# Patient Record
Sex: Female | Born: 1980 | Hispanic: Yes | Marital: Married | Smoking: Never smoker
Health system: Southern US, Community
[De-identification: ages and names within clinical notes are randomized; demographics above are authoritative.]

## PROBLEM LIST (undated history)

## (undated) DIAGNOSIS — F329 Major depressive disorder, single episode, unspecified: Secondary | ICD-10-CM

## (undated) DIAGNOSIS — Z789 Other specified health status: Secondary | ICD-10-CM

## (undated) DIAGNOSIS — F32A Depression, unspecified: Secondary | ICD-10-CM

## (undated) DIAGNOSIS — D219 Benign neoplasm of connective and other soft tissue, unspecified: Secondary | ICD-10-CM

## (undated) DIAGNOSIS — E0590 Thyrotoxicosis, unspecified without thyrotoxic crisis or storm: Secondary | ICD-10-CM

## (undated) HISTORY — PX: DILATION AND CURETTAGE OF UTERUS: SHX78

## (undated) HISTORY — DX: Thyrotoxicosis, unspecified without thyrotoxic crisis or storm: E05.90

---
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
## 2003-02-11 ENCOUNTER — Encounter: Admission: RE | Admit: 2003-02-11 | Discharge: 2003-02-11 | Payer: Self-pay | Admitting: Family Medicine

## 2003-05-29 ENCOUNTER — Emergency Department (HOSPITAL_COMMUNITY): Admission: EM | Admit: 2003-05-29 | Discharge: 2003-05-29 | Payer: Self-pay | Admitting: Emergency Medicine

## 2004-07-01 ENCOUNTER — Inpatient Hospital Stay (HOSPITAL_COMMUNITY): Admission: AD | Admit: 2004-07-01 | Discharge: 2004-07-01 | Payer: Self-pay | Admitting: Obstetrics & Gynecology

## 2004-08-23 ENCOUNTER — Ambulatory Visit (HOSPITAL_COMMUNITY): Admission: RE | Admit: 2004-08-23 | Discharge: 2004-08-23 | Payer: Self-pay | Admitting: *Deleted

## 2004-08-23 ENCOUNTER — Ambulatory Visit: Payer: Self-pay | Admitting: *Deleted

## 2004-08-30 ENCOUNTER — Ambulatory Visit: Payer: Self-pay | Admitting: *Deleted

## 2004-09-13 ENCOUNTER — Ambulatory Visit: Payer: Self-pay | Admitting: *Deleted

## 2004-09-27 ENCOUNTER — Ambulatory Visit: Payer: Self-pay | Admitting: *Deleted

## 2004-10-10 ENCOUNTER — Ambulatory Visit (HOSPITAL_COMMUNITY): Admission: RE | Admit: 2004-10-10 | Discharge: 2004-10-10 | Payer: Self-pay | Admitting: Obstetrics and Gynecology

## 2004-10-11 ENCOUNTER — Ambulatory Visit: Payer: Self-pay | Admitting: *Deleted

## 2004-10-26 ENCOUNTER — Ambulatory Visit: Payer: Self-pay | Admitting: Family Medicine

## 2004-11-03 ENCOUNTER — Inpatient Hospital Stay (HOSPITAL_COMMUNITY): Admission: AD | Admit: 2004-11-03 | Discharge: 2004-11-03 | Payer: Self-pay | Admitting: Obstetrics and Gynecology

## 2004-11-15 ENCOUNTER — Ambulatory Visit (HOSPITAL_COMMUNITY): Admission: RE | Admit: 2004-11-15 | Discharge: 2004-11-15 | Payer: Self-pay | Admitting: *Deleted

## 2004-11-16 ENCOUNTER — Ambulatory Visit: Payer: Self-pay | Admitting: Family Medicine

## 2004-11-29 ENCOUNTER — Inpatient Hospital Stay (HOSPITAL_COMMUNITY): Admission: AD | Admit: 2004-11-29 | Discharge: 2004-11-29 | Payer: Self-pay | Admitting: Obstetrics and Gynecology

## 2004-11-30 ENCOUNTER — Ambulatory Visit: Payer: Self-pay | Admitting: Family Medicine

## 2004-12-07 ENCOUNTER — Ambulatory Visit: Payer: Self-pay | Admitting: *Deleted

## 2004-12-14 ENCOUNTER — Ambulatory Visit: Payer: Self-pay | Admitting: Family Medicine

## 2004-12-21 ENCOUNTER — Ambulatory Visit: Payer: Self-pay | Admitting: Family Medicine

## 2004-12-28 ENCOUNTER — Ambulatory Visit: Payer: Self-pay | Admitting: Family Medicine

## 2005-01-04 ENCOUNTER — Ambulatory Visit: Payer: Self-pay | Admitting: Family Medicine

## 2005-01-05 ENCOUNTER — Inpatient Hospital Stay (HOSPITAL_COMMUNITY): Admission: AD | Admit: 2005-01-05 | Discharge: 2005-01-09 | Payer: Self-pay | Admitting: Obstetrics & Gynecology

## 2005-01-05 ENCOUNTER — Ambulatory Visit: Payer: Self-pay | Admitting: Obstetrics and Gynecology

## 2005-01-06 ENCOUNTER — Encounter (INDEPENDENT_AMBULATORY_CARE_PROVIDER_SITE_OTHER): Payer: Self-pay | Admitting: *Deleted

## 2005-01-10 ENCOUNTER — Inpatient Hospital Stay (HOSPITAL_COMMUNITY): Admission: AD | Admit: 2005-01-10 | Discharge: 2005-01-11 | Payer: Self-pay | Admitting: Obstetrics and Gynecology

## 2008-01-27 ENCOUNTER — Inpatient Hospital Stay (HOSPITAL_COMMUNITY): Admission: AD | Admit: 2008-01-27 | Discharge: 2008-01-27 | Payer: Self-pay | Admitting: Obstetrics & Gynecology

## 2009-05-10 ENCOUNTER — Emergency Department (HOSPITAL_COMMUNITY): Admission: EM | Admit: 2009-05-10 | Discharge: 2009-05-11 | Payer: Self-pay | Admitting: Emergency Medicine

## 2010-03-22 ENCOUNTER — Ambulatory Visit: Payer: Self-pay | Admitting: Internal Medicine

## 2010-03-22 LAB — CONVERTED CEMR LAB
Cholesterol: 132 mg/dL (ref 0–200)
Eosinophils Absolute: 0.2 10*3/uL (ref 0.0–0.7)
Glucose, Bld: 90 mg/dL (ref 70–99)
HCT: 41 % (ref 36.0–46.0)
HDL: 40 mg/dL (ref >39)
Hemoglobin: 13.7 g/dL (ref 12.0–15.0)
Lymphocytes Relative: 25 % (ref 12–46)
MCHC: 33.4 g/dL (ref 30.0–36.0)
Monocytes Relative: 6 % (ref 3–12)
Neutro Abs: 5.3 10*3/uL (ref 1.7–7.7)
Potassium: 4.2 meq/L (ref 3.5–5.3)
RBC: 4.97 M/uL (ref 3.87–5.11)
RDW: 13.7 % (ref 11.5–15.5)
Sodium: 137 meq/L (ref 135–145)
Total CHOL/HDL Ratio: 3.3
Triglycerides: 186 mg/dL — ABNORMAL HIGH (ref <150)
VLDL: 37 mg/dL (ref 0–40)

## 2010-08-31 ENCOUNTER — Encounter (INDEPENDENT_AMBULATORY_CARE_PROVIDER_SITE_OTHER): Payer: Self-pay | Admitting: Family Medicine

## 2010-08-31 LAB — CONVERTED CEMR LAB: Free T4: 1.11 ng/dL (ref 0.80–1.80)

## 2010-09-27 LAB — BASIC METABOLIC PANEL
Chloride: 107 mEq/L (ref 96–112)
GFR calc Af Amer: >60 mL/min (ref >60)
Potassium: 3.5 mEq/L (ref 3.5–5.1)
Sodium: 140 mEq/L (ref 135–145)

## 2010-09-27 LAB — DIFFERENTIAL
Eosinophils Absolute: 0.2 10*3/uL (ref 0.0–0.7)
Eosinophils Relative: 2 % (ref 0–5)
Lymphocytes Relative: 32 % (ref 12–46)
Lymphs Abs: 2.6 10*3/uL (ref 0.7–4.0)
Monocytes Relative: 6 % (ref 3–12)

## 2010-09-27 LAB — CBC
HCT: 37.8 % (ref 36.0–46.0)
MCV: 80.4 fL (ref 78.0–100.0)
RBC: 4.71 MIL/uL (ref 3.87–5.11)
WBC: 8.1 10*3/uL (ref 4.0–10.5)

## 2010-09-27 LAB — POCT PREGNANCY, URINE: Preg Test, Ur: NEGATIVE

## 2010-10-17 ENCOUNTER — Emergency Department (HOSPITAL_COMMUNITY)
Admission: EM | Admit: 2010-10-17 | Discharge: 2010-10-18 | Disposition: A | Discharge status: Home / Self Care | Payer: Self-pay | Attending: Emergency Medicine | Admitting: Emergency Medicine

## 2010-10-17 DIAGNOSIS — N949 Unspecified condition associated with female genital organs and menstrual cycle: Secondary | ICD-10-CM | POA: Insufficient documentation

## 2010-10-17 DIAGNOSIS — R110 Nausea: Secondary | ICD-10-CM | POA: Insufficient documentation

## 2010-10-17 DIAGNOSIS — N739 Female pelvic inflammatory disease, unspecified: Secondary | ICD-10-CM | POA: Insufficient documentation

## 2010-10-17 DIAGNOSIS — R109 Unspecified abdominal pain: Secondary | ICD-10-CM | POA: Insufficient documentation

## 2010-10-17 DIAGNOSIS — I10 Essential (primary) hypertension: Secondary | ICD-10-CM | POA: Insufficient documentation

## 2010-10-17 LAB — COMPREHENSIVE METABOLIC PANEL
ALT: 13 U/L (ref 0–35)
AST: 20 U/L (ref 0–37)
Albumin: 4.1 g/dL (ref 3.5–5.2)
Alkaline Phosphatase: 58 U/L (ref 39–117)
CO2: 24 mEq/L (ref 19–32)
Chloride: 107 mEq/L (ref 96–112)
GFR calc Af Amer: >60 mL/min (ref >60)
GFR calc non Af Amer: >60 mL/min (ref >60)
Potassium: 3.7 mEq/L (ref 3.5–5.1)
Sodium: 135 mEq/L (ref 135–145)
Total Bilirubin: 0.3 mg/dL (ref 0.3–1.2)

## 2010-10-17 LAB — URINALYSIS, ROUTINE W REFLEX MICROSCOPIC
Glucose, UA: NEGATIVE mg/dL
Hgb urine dipstick: NEGATIVE
Specific Gravity, Urine: 1.024 (ref 1.005–1.030)
Urobilinogen, UA: 0.2 mg/dL (ref 0.0–1.0)

## 2010-10-17 LAB — DIFFERENTIAL
Basophils Relative: 0 % (ref 0–1)
Eosinophils Absolute: 0.3 10*3/uL (ref 0.0–0.7)
Monocytes Relative: 6 % (ref 3–12)
Neutro Abs: 7.4 10*3/uL (ref 1.7–7.7)
Neutrophils Relative %: 61 % (ref 43–77)

## 2010-10-17 LAB — CBC
Hemoglobin: 13.4 g/dL (ref 12.0–15.0)
MCH: 27.6 pg (ref 26.0–34.0)
Platelets: 243 10*3/uL (ref 150–400)
RBC: 4.85 MIL/uL (ref 3.87–5.11)
WBC: 12.1 10*3/uL — ABNORMAL HIGH (ref 4.0–10.5)

## 2010-10-18 ENCOUNTER — Emergency Department (HOSPITAL_COMMUNITY): Payer: Self-pay

## 2010-10-18 LAB — GC/CHLAMYDIA PROBE AMP, GENITAL
Chlamydia, DNA Probe: NEGATIVE
GC Probe Amp, Genital: NEGATIVE

## 2010-10-18 LAB — WET PREP, GENITAL: Trich, Wet Prep: NONE SEEN

## 2010-10-18 MED ORDER — IOHEXOL 300 MG/ML  SOLN
70.0000 mL | Freq: Once | INTRAMUSCULAR | Status: AC | PRN
  Administered 2010-10-18: 75 mL via INTRAVENOUS

## 2010-11-10 NOTE — Op Note (Signed)
NAMESHAMAR, KRACKE                 ACCOUNT NO.:  [id number]   MEDICAL RECORD NO.:  [id number]          PATIENT TYPE:  INP   LOCATION:  9128                          FACILITY:  [facility]   PHYSICIAN:  Lesly Dukes, M.D. DATE OF BIRTH:  April 30, 1981   DATE OF PROCEDURE:  01/06/2005  DATE OF DISCHARGE:                                 OPERATIVE REPORT   PREOPERATIVE DIAGNOSIS:  A 30 year old G1 P0 at 40-plus weeks estimated  gestational age with thick meconium, deep variables, remote delivery.   PREOPERATIVE DIAGNOSES:  28.  A 30 year old G1 P0 at 40-plus weeks estimated gestational age with      thick meconium, deep variables, remote delivery.  2.  Hyperspiraled cord.   PROCEDURE:  Primary low flap transverse cesarean section.   SURGEON:  Lesly Dukes, M.D.   ANESTHESIA:  Epidural.   ESTIMATED BLOOD LOSS:  700 cc.   COMPLICATION:  __________   PATHOLOGY:  Placenta.   FINDINGS:  Viable infant.  Apgars 9 at one minute and 9 at five minutes.  Delivered from vertex presentation.  Thick meconium.  DeLee suctioned on the  abdomen.  Hyperspiraled cord.  Otherwise, grossly normal placenta.  Venous  cord gas was 7.34.  Grossly normal uterus, ovaries, and fallopian tubes  bilaterally.   PROCEDURE:  After informed consent was obtained, the patient was taken to  the operating room, where epidural anesthesia was found to be adequate.  The  patient was placed in the dorsal supine position with a leftward tilt, and a  Foley was already in the bladder.  A Pfannenstiel skin incision was made  with a scalpel and carried down to the underlying layer of fascia. The  fascia was incised in the midline.  This incision was extended bilaterally.  Superior and inferior aspects of the fascial incision were grasped with  Kocher clamps, __________, and dissected out sharply and bluntly to the  underlying layers of rectus muscles.  The rectus muscles were separated in  the midline.  The peritoneum was  identified and entered bluntly.  This  incision was entered, with good visualization of the bladder.  The bladder  blade was inserted.  The vesicouterine peritoneum was identified, tented up,  and entered sharply with Metzenbaum scissors.  This incision was extended  bilaterally, and the bladder flap was created digitally.  The bladder blade  was reinserted.  The uterine incision was made in a transverse fashion in  the lower uterine segment.  This incision was extended bilaterally bluntly.  The head delivered easily, and then the nose and mouth were suctioned with  the DeLee.  The rest of the baby's body delivered easily.  The cord was  clamped and cut, and the baby was handed off to the awaiting pediatrician.  Cord blood was sent for type and screen, and a venous cord gas was also  sent.  The placenta was delivered manually, and the uterus exteriorized and  cleared of all clots and debris.  Pitocin and Methergine were used to help  with some mild uterine atony.  The uterine incision was  closed with 0 Vicryl  in a running-locked fashion, and good hemostasis was noted.  A second suture  of 0 Vicryl was used to reinforce portions of the incision.  The uterus was  returned to the abdomen, and the uterus was noted to be hemostatic off  tension.  The abdominal cavity was irrigated and once again uterine  hemostasis was noted.  The peritoneum and rectus muscles were noted to be  hemostatic.  The fascia was closed with 0 Vicryl in a running fashion.  Subcutaneous tissue was copiously irrigated and found to be hemostatic.  Staples were used to reapproximate the skin.  The patient tolerated the  procedure well.  Sponge, lap, instrument, and needle counts were correct x2,  and the patient went to the recovery room in stable condition.       KHL/MEDQ  D:  01/06/2005  T:  01/07/2005  Job:  [id number]

## 2010-11-10 NOTE — Group Therapy Note (Signed)
NAME:  MURL, ZOGG NO.:   MEDICAL RECORD NO.:  [id number]                   PATIENT TYPE:   LOCATION:  [facility]                           FACILITY:   PHYSICIAN:  Tinnie Gens, MD                     DATE OF BIRTH:   DATE OF SERVICE:  02/11/2003                                    CLINIC NOTE   CHIEF COMPLAINT:  GYN examination.   HISTORY OF PRESENT ILLNESS:  The patient is a 30 year old gravida 0 who is  new to this office today.  She comes in requesting Lucienne Minks for birth  control.  She has been sexually active for the last three years and has had  two partners.  She reports that she has monthly periods, although they are  not always starting on the same date.  Her last period was January 07, 2003.  She has never had a Pap smear, desires one of these as well.   PAST MEDICAL HISTORY:  Asthma as a child, she has outgrown.   PAST SURGICAL HISTORY:  None.   PAST OBSTETRICAL HISTORY:  She is a G0.   GYN HISTORY:  Menarche was at age 30.  Periods are once a month and last  five days.  Her periods are very light.  She has no known history of STDs or  has never had a Pap previously.   FAMILY HISTORY:  Significant for diabetes in her grandfather and  hypertension in a sister.   SOCIAL HISTORY:  She is an occasional alcohol drinker, but denies drug or  tobacco use.   REVIEW OF SYSTEMS:  Significant for vaginal odor for the last couple weeks.  Otherwise, review of systems is reviewed and negative, although she does  complain of a rash on her skin.   PHYSICAL EXAMINATION:  VITAL SIGNS:  Weight 93.3, pulse 111, blood pressure  104/69.  GENERAL:  She is a well-developed, well-nourished Hispanic female in no  acute distress.  NECK:  She has a somewhat enlarged thyroid that is diffusely enlarged.  ABDOMEN:  Soft, nontender, nondistended.  GENITOURINARY:  Normal external female genitalia.  Vagina is rugated.  There  is a white discharge noted.   Cervix is nulliparous and anterior.  Pap smear  is obtained as well as a wet prep.  The uterus is small and anteverted.  Adnexa are without tenderness or mass.  SKIN:  She has some hypopigmented areas on her upper extremities.   LABORATORIES:  Wet prep reveals positive clue cells.  Urine pregnancy test  was negative today.   IMPRESSION:  1. Gynecologic examination.  2. Contraceptive counseling.  3. Bacterial vaginitis.  4. Enlarged thyroid.  5. Tinea versicolor.   PLAN:  1. Pap smear.  GC, Chlamydia.  Flagyl 500 mg p.o. b.i.d.  2. Ketoconazole 400 mg p.o. daily for tinea versicolor.  3. Check TSH and consider imaging of  her thyroid depending on what her TSH     shows.  Prescription given for Ortho-Evra patch and instructions given on     how to use it.  She does receive one sample patch.  She is instructed to     start Sunday after her next menses.                                               Tinnie Gens, MD    TP/MEDQ  D:  02/11/2003  T:  02/12/2003  Job:  [id number]

## 2010-11-10 NOTE — Discharge Summary (Signed)
Janice Bernard, Janice Bernard                 ACCOUNT NO.:  [id number]   MEDICAL RECORD NO.:  [id number]          PATIENT TYPE:  INP   LOCATION:  9128                          [facility]   PHYSICIAN:  Lesly Dukes, M.D. DATE OF BIRTH:  July 29, 1980   DATE OF ADMISSION:  01/05/2005  DATE OF DISCHARGE:                                 DISCHARGE SUMMARY   ADMISSION DIAGNOSIS:  Intrauterine pregnancy at 40 and two-sevenths weeks.   DISCHARGE DIAGNOSIS:  Primary low transverse cesarean section status post  intrauterine pregnancy.   PROCEDURES:  Primary low transverse cesarean section on January 06, 2005.   FINAL DIAGNOSES:  1.  Intrauterine pregnancy at 40 and two-sevenths weeks.  2.  Primary cesarean section.  3.  Active labor at 40 and two-sevenths weeks on admission.   HOSPITAL COURSE:  This patient is a 30 year old G2 P1-0-1-0 presenting at 64  and two-sevenths weeks to the MAU with contractions that had started on January 05, 2005. The patient was admitted to labor and delivery. The patient  ruptured while on labor and delivery, spontaneous rupture. The patient began  having several large variables to the 80s with contractions. These variables  became deep. The patient had thick meconium. We were unable to start Pitocin  and the natural contractions were not changing the cervix. The patient  consented for C-section, understood the risks. Informed consent was obtained  and the patient was taken to the operating room where spinal anesthesia was  found to be adequate. The patient had a low transverse C-section performed  by Dr. Elsie Lincoln. A female infant was delivered. Cord blood was sent for  type and screen. Placenta delivered spontaneously with a three-vessel cord.  The uterus was cleared of all clots. The uterus was closed with 3-0 Vicryl  and sutured with Monocryl to reinforce the incision and hemostasis. The  patient tolerated the procedure well and was taken to the mother-baby unit.  The patient had routine postoperative care and tolerated this well.   LABORATORY DATA:  The patient's hemoglobin on discharge was 8.8, her  hematocrit was 27.3 on January 07, 2005.   MEDICATIONS:  The patient was sent out on:  1.  Ibuprofen 600 mg take one p.o. q.6h. p.r.n. pain.  2.  Prenatal vitamins take one p.o. daily.      Hei   HG/MEDQ  D:  01/09/2005  T:  01/09/2005  Job:  [id number]

## 2010-12-05 ENCOUNTER — Inpatient Hospital Stay (HOSPITAL_COMMUNITY)
Admission: AD | Admit: 2010-12-05 | Discharge: 2010-12-05 | Disposition: A | Discharge status: Home / Self Care | Payer: Self-pay | Source: Ambulatory Visit | Attending: Obstetrics & Gynecology | Admitting: Obstetrics & Gynecology

## 2010-12-05 ENCOUNTER — Inpatient Hospital Stay (HOSPITAL_COMMUNITY): Payer: Self-pay

## 2010-12-05 DIAGNOSIS — O209 Hemorrhage in early pregnancy, unspecified: Secondary | ICD-10-CM

## 2010-12-05 LAB — WET PREP, GENITAL: Clue Cells Wet Prep HPF POC: NONE SEEN

## 2010-12-05 LAB — URINALYSIS, ROUTINE W REFLEX MICROSCOPIC
Bilirubin Urine: NEGATIVE
Ketones, ur: NEGATIVE mg/dL
Leukocytes, UA: NEGATIVE
Nitrite: NEGATIVE
Protein, ur: NEGATIVE mg/dL
Urobilinogen, UA: 0.2 mg/dL (ref 0.0–1.0)
pH: 6 (ref 5.0–8.0)

## 2010-12-05 LAB — CBC
Hemoglobin: 12 g/dL (ref 12.0–15.0)
MCH: 27.2 pg (ref 26.0–34.0)
MCHC: 34.3 g/dL (ref 30.0–36.0)
Platelets: 177 10*3/uL (ref 150–400)

## 2010-12-05 LAB — POCT PREGNANCY, URINE: Preg Test, Ur: POSITIVE

## 2010-12-05 LAB — ABO/RH: ABO/RH(D): O POS

## 2010-12-05 LAB — HCG, QUANTITATIVE, PREGNANCY: hCG, Beta Chain, Quant, S: 23082 m[IU]/mL — ABNORMAL HIGH (ref <5)

## 2010-12-06 LAB — GC/CHLAMYDIA PROBE AMP, GENITAL
Chlamydia, DNA Probe: NEGATIVE
GC Probe Amp, Genital: NEGATIVE

## 2010-12-24 ENCOUNTER — Inpatient Hospital Stay (HOSPITAL_COMMUNITY)
Admission: AD | Admit: 2010-12-24 | Discharge: 2010-12-24 | Disposition: A | Discharge status: Home / Self Care | Payer: Self-pay | Source: Ambulatory Visit | Attending: Obstetrics & Gynecology | Admitting: Obstetrics & Gynecology

## 2010-12-24 DIAGNOSIS — M549 Dorsalgia, unspecified: Secondary | ICD-10-CM

## 2010-12-24 DIAGNOSIS — R109 Unspecified abdominal pain: Secondary | ICD-10-CM | POA: Insufficient documentation

## 2010-12-24 DIAGNOSIS — T7411XA Adult physical abuse, confirmed, initial encounter: Secondary | ICD-10-CM | POA: Insufficient documentation

## 2010-12-24 DIAGNOSIS — O99891 Other specified diseases and conditions complicating pregnancy: Secondary | ICD-10-CM | POA: Insufficient documentation

## 2010-12-24 DIAGNOSIS — IMO0002 Reserved for concepts with insufficient information to code with codable children: Secondary | ICD-10-CM | POA: Insufficient documentation

## 2010-12-24 DIAGNOSIS — O9989 Other specified diseases and conditions complicating pregnancy, childbirth and the puerperium: Secondary | ICD-10-CM

## 2010-12-24 LAB — URINALYSIS, ROUTINE W REFLEX MICROSCOPIC
Bilirubin Urine: NEGATIVE
Ketones, ur: NEGATIVE mg/dL
Leukocytes, UA: NEGATIVE
Nitrite: NEGATIVE
Protein, ur: NEGATIVE mg/dL
Urobilinogen, UA: 0.2 mg/dL (ref 0.0–1.0)
pH: 6 (ref 5.0–8.0)

## 2010-12-24 LAB — WET PREP, GENITAL

## 2011-01-09 ENCOUNTER — Inpatient Hospital Stay (HOSPITAL_COMMUNITY)
Admission: AD | Admit: 2011-01-09 | Discharge: 2011-01-09 | Disposition: A | Discharge status: Home / Self Care | Payer: Self-pay | Source: Ambulatory Visit | Attending: Obstetrics & Gynecology | Admitting: Obstetrics & Gynecology

## 2011-01-09 ENCOUNTER — Encounter (HOSPITAL_COMMUNITY): Payer: Self-pay | Admitting: *Deleted

## 2011-01-09 DIAGNOSIS — N760 Acute vaginitis: Secondary | ICD-10-CM | POA: Insufficient documentation

## 2011-01-09 DIAGNOSIS — A499 Bacterial infection, unspecified: Secondary | ICD-10-CM | POA: Insufficient documentation

## 2011-01-09 DIAGNOSIS — Z331 Pregnant state, incidental: Secondary | ICD-10-CM

## 2011-01-09 DIAGNOSIS — N949 Unspecified condition associated with female genital organs and menstrual cycle: Secondary | ICD-10-CM | POA: Insufficient documentation

## 2011-01-09 DIAGNOSIS — O2390 Unspecified genitourinary tract infection in pregnancy, unspecified trimester: Secondary | ICD-10-CM | POA: Insufficient documentation

## 2011-01-09 DIAGNOSIS — B9689 Other specified bacterial agents as the cause of diseases classified elsewhere: Secondary | ICD-10-CM | POA: Insufficient documentation

## 2011-01-09 HISTORY — DX: Other specified health status: Z78.9

## 2011-01-09 LAB — URINALYSIS, ROUTINE W REFLEX MICROSCOPIC
Bilirubin Urine: NEGATIVE
Ketones, ur: NEGATIVE mg/dL
Nitrite: NEGATIVE
Protein, ur: NEGATIVE mg/dL
pH: 6.5 (ref 5.0–8.0)

## 2011-01-09 LAB — WET PREP, GENITAL
Trich, Wet Prep: NONE SEEN
WBC, Wet Prep HPF POC: NONE SEEN
Yeast Wet Prep HPF POC: NONE SEEN

## 2011-01-09 MED ORDER — METRONIDAZOLE 500 MG PO TABS: 500.0000 mg | ORAL_TABLET | Freq: Three times a day (TID) | ORAL | Status: AC

## 2011-01-09 NOTE — Plan of Care (Signed)
Sp interpreteur at bedside explaining BV and treatment and answering questions re: STD's and the use of condoms.

## 2011-01-09 NOTE — Progress Notes (Signed)
Pt G2P1 at 11.3wks, reports vaginal discharge with odor x 1wk.

## 2011-01-09 NOTE — ED Provider Notes (Addendum)
History     Chief Complaint  Patient presents with  . Vaginal Discharge    pt states her partner had sex with someone else and she wants to be checked   Patient is a 30 y.o. female presenting with vaginal discharge.  Vaginal Discharge This is a new problem. The current episode started in the past 7 days. The problem has been unchanged. The symptoms are aggravated by nothing. She has tried nothing for the symptoms.  The patient states that her sex partner had sex with someone else and she wants to get checked for STI's. She is 11 wks. Gest.   Past Medical History  Diagnosis Date  . No pertinent past medical history     Past Surgical History  Procedure Date  . Cesarean section     Family History  Problem Relation Age of Onset  . Depression Paternal Grandfather     History  Substance Use Topics  . Smoking status: Not on file  . Smokeless tobacco: Not on file  . Alcohol Use:     OB History    Grav Para Term Preterm Abortions TAB SAB Ect Mult Living   2 1        1       Review of Systems  Genitourinary: Positive for vaginal discharge.  All other systems reviewed and are negative.    Physical Exam  BP 114/63  Pulse 79  Temp(Src) 98.8 F (37.1 C) (Oral)  Resp 16  SpO2 99%  LMP 09/24/2010  Physical Exam  Nursing note and vitals reviewed. Constitutional: She is oriented to person, place, and time. She appears well-developed and well-nourished.  Eyes: EOM are normal.  Neck: Neck supple.  Pulmonary/Chest: Effort normal.  Abdominal: Soft. There is no tenderness.       Positive fetal heart tones.  Genitourinary: Vagina normal. Uterus is enlarged. Cervix exhibits discharge. Cervix exhibits no motion tenderness. Right adnexum displays no tenderness. Left adnexum displays no tenderness.       Small amount of white vaginal d/c. Cervix long and closed. Uterus approx. 12 wk. Size.  Musculoskeletal: Normal range of motion.  Neurological: She is alert and oriented to  person, place, and time. No cranial nerve deficit.  Skin: Skin is warm and dry.    ED Course  Procedures  MDM Assessment: STD screening Bacterial Vaginosis at 11 wks. Gest. Cultures for GC and Chlamydia pending. Plan: will treat BV with Flagyl and await cultures, will call patient if cultures are positive.      [place], [place] 01/25/11 1843

## 2011-01-10 LAB — GC/CHLAMYDIA PROBE AMP, GENITAL: Chlamydia, DNA Probe: NEGATIVE

## 2011-02-06 ENCOUNTER — Ambulatory Visit (HOSPITAL_COMMUNITY)
Admission: AD | Admit: 2011-02-06 | Discharge: 2011-02-06 | DRG: 770 | Disposition: A | Discharge status: Home / Self Care | Payer: Self-pay | Source: Ambulatory Visit | Attending: Obstetrics & Gynecology | Admitting: Obstetrics & Gynecology

## 2011-02-06 ENCOUNTER — Inpatient Hospital Stay (HOSPITAL_COMMUNITY): Payer: Self-pay | Admitting: Anesthesiology

## 2011-02-06 ENCOUNTER — Encounter (HOSPITAL_COMMUNITY): Payer: Self-pay | Admitting: Anesthesiology

## 2011-02-06 ENCOUNTER — Inpatient Hospital Stay (HOSPITAL_COMMUNITY): Payer: Self-pay

## 2011-02-06 ENCOUNTER — Encounter (HOSPITAL_COMMUNITY)
Admission: AD | Disposition: A | Discharge status: Home / Self Care | Payer: Self-pay | Source: Ambulatory Visit | Attending: Obstetrics & Gynecology

## 2011-02-06 ENCOUNTER — Ambulatory Visit: Admit: 2011-02-06 | Payer: Self-pay | Admitting: Obstetrics & Gynecology

## 2011-02-06 ENCOUNTER — Other Ambulatory Visit: Payer: Self-pay | Admitting: Obstetrics & Gynecology

## 2011-02-06 ENCOUNTER — Encounter (HOSPITAL_COMMUNITY): Payer: Self-pay

## 2011-02-06 DIAGNOSIS — O021 Missed abortion: Secondary | ICD-10-CM

## 2011-02-06 HISTORY — PX: DILATION AND EVACUATION: SHX1459

## 2011-02-06 LAB — CBC
Hemoglobin: 12 g/dL (ref 12.0–15.0)
MCH: 27.8 pg (ref 26.0–34.0)
MCV: 81.7 fL (ref 78.0–100.0)
RBC: 4.32 MIL/uL (ref 3.87–5.11)

## 2011-02-06 LAB — URINALYSIS, ROUTINE W REFLEX MICROSCOPIC
Bilirubin Urine: NEGATIVE
Hgb urine dipstick: NEGATIVE
Ketones, ur: NEGATIVE mg/dL
Specific Gravity, Urine: 1.015 (ref 1.005–1.030)
pH: 7 (ref 5.0–8.0)

## 2011-02-06 SURGERY — DILATION AND EVACUATION, UTERUS
Anesthesia: Choice

## 2011-02-06 MED ORDER — OXYTOCIN 10 UNIT/ML IJ SOLN
INTRAMUSCULAR | Status: DC | PRN
  Administered 2011-02-06: 20 [IU] via INTRAMUSCULAR

## 2011-02-06 MED ORDER — FENTANYL CITRATE 0.05 MG/ML IJ SOLN: 25.0000 ug | INTRAMUSCULAR | Status: DC | PRN

## 2011-02-06 MED ORDER — HYDROCODONE-ACETAMINOPHEN 5-500 MG PO TABS: 1.0000 | ORAL_TABLET | ORAL | Status: AC | PRN

## 2011-02-06 MED ORDER — FENTANYL CITRATE 0.05 MG/ML IJ SOLN
INTRAMUSCULAR | Status: DC | PRN
  Administered 2011-02-06: 100 ug via INTRAVENOUS

## 2011-02-06 MED ORDER — FAMOTIDINE IN NACL 20-0.9 MG/50ML-% IV SOLN
20.0000 mg | Freq: Once | INTRAVENOUS | Status: AC
  Administered 2011-02-06: 20 mg via INTRAVENOUS
  Filled 2011-02-06: qty 50

## 2011-02-06 MED ORDER — KETOROLAC TROMETHAMINE 30 MG/ML IJ SOLN
INTRAMUSCULAR | Status: AC
  Filled 2011-02-06: qty 1

## 2011-02-06 MED ORDER — ACETAMINOPHEN 325 MG PO TABS: 325.0000 mg | ORAL_TABLET | ORAL | Status: DC | PRN

## 2011-02-06 MED ORDER — LACTATED RINGERS IV SOLN
INTRAVENOUS | Status: DC
  Administered 2011-02-06 (×2): via INTRAVENOUS

## 2011-02-06 MED ORDER — KETOROLAC TROMETHAMINE 30 MG/ML IJ SOLN
INTRAMUSCULAR | Status: DC | PRN
  Administered 2011-02-06: 30 mg via INTRAVENOUS

## 2011-02-06 MED ORDER — PROPOFOL 10 MG/ML IV EMUL
INTRAVENOUS | Status: DC | PRN
  Administered 2011-02-06 (×5): 20 mg via INTRAVENOUS

## 2011-02-06 MED ORDER — DOXYCYCLINE HYCLATE 100 MG PO TABS: 100.0000 mg | ORAL_TABLET | Freq: Two times a day (BID) | ORAL | Status: AC

## 2011-02-06 MED ORDER — BUPIVACAINE-EPINEPHRINE 0.5% -1:200000 IJ SOLN
INTRAMUSCULAR | Status: DC | PRN
  Administered 2011-02-06: 6 mL

## 2011-02-06 MED ORDER — DEXAMETHASONE SODIUM PHOSPHATE 10 MG/ML IJ SOLN
INTRAMUSCULAR | Status: DC | PRN
  Administered 2011-02-06: 10 mg via INTRAVENOUS

## 2011-02-06 MED ORDER — OXYTOCIN 10 UNIT/ML IJ SOLN
INTRAMUSCULAR | Status: AC
  Filled 2011-02-06: qty 2

## 2011-02-06 MED ORDER — PROMETHAZINE HCL 25 MG/ML IJ SOLN: 6.2500 mg | INTRAMUSCULAR | Status: DC | PRN

## 2011-02-06 MED ORDER — LIDOCAINE HCL (CARDIAC) 20 MG/ML IV SOLN
INTRAVENOUS | Status: DC | PRN
  Administered 2011-02-06: 40 mg via INTRAVENOUS

## 2011-02-06 MED ORDER — KETOROLAC TROMETHAMINE 30 MG/ML IJ SOLN: 15.0000 mg | Freq: Once | INTRAMUSCULAR | Status: DC | PRN

## 2011-02-06 MED ORDER — MIDAZOLAM HCL 5 MG/5ML IJ SOLN
INTRAMUSCULAR | Status: DC | PRN
  Administered 2011-02-06: 2 mg via INTRAVENOUS

## 2011-02-06 SURGICAL SUPPLY — 21 items
CATH ROBINSON RED A/P 16FR (CATHETERS) ×2 IMPLANT
CLOTH BEACON ORANGE TIMEOUT ST (SAFETY) ×2 IMPLANT
DECANTER SPIKE VIAL GLASS SM (MISCELLANEOUS) IMPLANT
DRAPE UTILITY XL STRL (DRAPES) ×2 IMPLANT
GLOVE BIO SURGEON STRL SZ 6.5 (GLOVE) ×2 IMPLANT
GLOVE BIOGEL PI IND STRL 7.0 (GLOVE) ×2 IMPLANT
GLOVE BIOGEL PI INDICATOR 7.0 (GLOVE) ×2
GOWN PREVENTION PLUS LG XLONG (DISPOSABLE) ×4 IMPLANT
KIT BERKELEY 1ST TRIMESTER 3/8 (MISCELLANEOUS) IMPLANT
NDL SPNL 22GX3.5 QUINCKE BK (NEEDLE) IMPLANT
NEEDLE SPNL 22GX3.5 QUINCKE BK (NEEDLE) IMPLANT
PACK VAGINAL MINOR WOMEN LF (CUSTOM PROCEDURE TRAY) ×2 IMPLANT
PAD PREP 24X48 CUFFED NSTRL (MISCELLANEOUS) ×2 IMPLANT
SET BERKELEY SUCTION TUBING (SUCTIONS) IMPLANT
SYR CONTROL 10ML LL (SYRINGE) IMPLANT
TOWEL OR 17X24 6PK STRL BLUE (TOWEL DISPOSABLE) ×4 IMPLANT
VACURETTE 10 RIGID CVD (CANNULA) IMPLANT
VACURETTE 7MM CVD STRL WRAP (CANNULA) IMPLANT
VACURETTE 8 RIGID CVD (CANNULA) IMPLANT
VACURETTE 9 RIGID CVD (CANNULA) IMPLANT
WATER STERILE IRR 1000ML POUR (IV SOLUTION) ×2 IMPLANT

## 2011-02-06 NOTE — Brief Op Note (Signed)
Brief op note done, note dictated

## 2011-02-06 NOTE — Progress Notes (Signed)
Discharge instructions reviewed with interpretor.

## 2011-02-06 NOTE — ED Provider Notes (Signed)
History     Chief Complaint  Patient presents with  . Vaginal Bleeding  . Abdominal Cramping   HPI Comments: Patient is scheduled for dilation and curettage today at 1830, counseled via hospital interpreter.  30 y.o. G2P1 at [redacted]w[redacted]d with c/o light vaginal bleeding and cramping starting this morning.   OB History    Grav Para Term Preterm Abortions TAB SAB Ect Mult Living   2 1        1       Past Medical History  Diagnosis Date  . No pertinent past medical history   . Gestational diabetes     diet control    Past Surgical History  Procedure Date  . Cesarean section     Family History  Problem Relation Age of Onset  . Depression Paternal Grandfather   . Diabetes Paternal Uncle     History  Substance Use Topics  . Smoking status: Not on file  . Smokeless tobacco: Not on file  . Alcohol Use:     Allergies: No Known Allergies  Prescriptions prior to admission  Medication Sig Dispense Refill  . prenatal vitamin w/FE, FA (PRENATAL 1 + 1) 27-1 MG TABS Take 2 tablets by mouth daily.          Review of Systems  Constitutional: Negative.   Respiratory: Negative.   Cardiovascular: Negative.   Gastrointestinal: Negative for nausea, vomiting, abdominal pain, diarrhea and constipation.  Genitourinary: Negative for dysuria, urgency, frequency, hematuria and flank pain.       Positive for vaginal bleeding, cramping  Musculoskeletal: Negative.   Neurological: Negative.   Psychiatric/Behavioral: Negative.    Physical Exam   Blood pressure 112/72, pulse 83, temperature 98.8 F (37.1 C), temperature source Oral, resp. rate 20, height 4' 11.5" (1.511 m), weight 49.102 kg (108 lb 4 oz), last menstrual period 09/24/2010.  Physical Exam  Constitutional: She appears well-developed and well-nourished. No distress.  Cardiovascular: Normal rate.   Respiratory: Effort normal.  GI: Soft. She exhibits no distension and no mass. There is no tenderness. There is no rebound and no  guarding.  Genitourinary: Uterus is not deviated, not enlarged, not fixed and not tender. Right adnexum displays no mass, no tenderness and no fullness. Left adnexum displays no mass, no tenderness and no fullness. There is bleeding (small) around the vagina. No tenderness around the vagina.    MAU Course  Procedures  Results for orders placed during the hospital encounter of 02/06/11 (from the past 24 hour(s))  URINALYSIS, ROUTINE W REFLEX MICROSCOPIC     Status: Normal   Collection Time   02/06/11 11:23 AM      Component Value Range   Color, Urine YELLOW  YELLOW    Appearance CLEAR  CLEAR    Specific Gravity, Urine 1.015  1.005 - 1.030    pH 7.0  5.0 - 8.0    Glucose, UA NEGATIVE  NEGATIVE (mg/dL)   Hgb urine dipstick NEGATIVE  NEGATIVE    Bilirubin Urine NEGATIVE  NEGATIVE    Ketones, ur NEGATIVE  NEGATIVE (mg/dL)   Protein, ur NEGATIVE  NEGATIVE (mg/dL)   Urobilinogen, UA 0.2  0.0 - 1.0 (mg/dL)   Nitrite NEGATIVE  NEGATIVE    Leukocytes, UA NEGATIVE  NEGATIVE   CBC     Status: Abnormal   Collection Time   02/06/11 12:09 PM      Component Value Range   WBC 6.7  4.0 - 10.5 (K/uL)   RBC 4.32  3.87 -  5.11 (MIL/uL)   Hemoglobin 12.0  12.0 - 15.0 (g/dL)   HCT 16.1 (*) 09.6 - 46.0 (%)   MCV 81.7  78.0 - 100.0 (fL)   MCH 27.8  26.0 - 34.0 (pg)   MCHC 34.0  30.0 - 36.0 (g/dL)   RDW 04.5  40.9 - 81.1 (%)   Platelets 176  150 - 400 (K/uL)    U/S shows 11 week demise - Dr. Debroah Loop consulted, pt desires to schedule D&C    Assessment and Plan  30 yo G2P1 with 11 wk SAB D&C with Dr. Debroah Loop scheduled tonight at 1830   [facility] 02/06/2011, 2:35 PM

## 2011-02-06 NOTE — Progress Notes (Signed)
Patient is here with c/o lower abdominal cramping and bleeding that started this morning. No bleeding noted. She last had intercourse a week ago. She states that she lost 5lbs a month.

## 2011-02-06 NOTE — Anesthesia Postprocedure Evaluation (Signed)
  Anesthesia Post-op Note  Patient: Janice Bernard  Procedure(s) Performed:  DILATATION AND EVACUATION (D&E)  Patient Location: PACU  Anesthesia Type: MAC  Level of Consciousness: awake, alert  and oriented  Airway and Oxygen Therapy: Patient Spontanous Breathing  Post-op Pain: none  Post-op Assessment: Post-op Vital signs reviewed  Post-op Vital Signs: Reviewed and stable  Complications: No apparent anesthesia complications

## 2011-02-06 NOTE — Progress Notes (Signed)
Pt states, " I got up this morning and there was blood in my panties, and when I wiped. I have cramping like I'm starting my period."

## 2011-02-06 NOTE — Brief Op Note (Signed)
02/06/2011  7:27 PM  PATIENT:  Janice Bernard  30 y.o. female  PRE-OPERATIVE DIAGNOSIS:  Missed Abortion  POST-OPERATIVE DIAGNOSIS:  Missed Abortion  PROCEDURE:  Procedure(s): DILATATION AND EVACUATION (D&E)  SURGEON:  Surgeon(s): Scheryl Darter, MD  PHYSICIAN ASSISTANT: none  ASSISTANTS: none   ANESTHESIA:   local and MAC  ESTIMATED BLOOD LOSS: * No blood loss amount entered *   BLOOD ADMINISTERED:none  DRAINS: none   LOCAL MEDICATIONS USED:  MARCAINE 6CC  SPECIMEN:  Source of Specimen:  uterus  DISPOSITION OF SPECIMEN:  PATHOLOGY  COUNTS:  YES  TOURNIQUET:  * No tourniquets in log *  DICTATION #: [id number]  PLAN OF CARE: postoperative, discharge home  PATIENT DISPOSITION:  PACU - hemodynamically stable.   Delay start of Pharmacological VTE agent (>24hrs) due to surgical blood loss or risk of bleeding:  not applicable              02/06/2011  7:28 PM  PATIENT:  Janice Bernard  30 y.o. female  PRE-OPERATIVE DIAGNOSIS:  Missed Abortion  POST-OPERATIVE DIAGNOSIS:  Missed Abortion  PROCEDURE:  Procedure(s): DILATATION AND EVACUATION (D&E)  SURGEON:  Surgeon(s): Scheryl Darter, MD  PHYSICIAN ASSISTANT:   ASSISTANTS: none   ANESTHESIA:   local and MAC  ESTIMATED BLOOD LOSS: * No blood loss amount entered *   BLOOD ADMINISTERED:none  DRAINS: none   LOCAL MEDICATIONS USED:  MARCAINE 6CC  SPECIMEN:  Source of Specimen:  POC, uterine  DISPOSITION OF SPECIMEN:  PATHOLOGY  COUNTS:  YES  TOURNIQUET:  * No tourniquets in log *  DICTATION #: [id number]  PLAN OF CARE: postoperative  PATIENT DISPOSITION:  PACU - hemodynamically stable.   Delay start of Pharmacological VTE agent (>24hrs) due to surgical blood loss or risk of bleeding:  not applicable

## 2011-02-06 NOTE — Transfer of Care (Signed)
Immediate Anesthesia Transfer of Care Note  Patient: Janice Bernard  Procedure(s) Performed:  DILATATION AND EVACUATION (D&E)  Patient Location: PACU  Anesthesia Type: MAC  Level of Consciousness: awake, alert  and oriented  Airway & Oxygen Therapy: Patient Spontanous Breathing  Post-op Assessment: Report given to PACU RN  Post vital signs: Reviewed and stable  Complications: No apparent anesthesia complications

## 2011-02-06 NOTE — Progress Notes (Signed)
Interpretor at pt bedside.  All questions answered.

## 2011-02-06 NOTE — Consult Note (Signed)
Note by Telford Nab reviewed. Pt has missed abortion at 15 weeks by previous ultrasound, 11 week size by ultrasound today. She requests dilation and curettage, which scheduled for 02/09/11 at 1000. The procedure and the risks of bleeding, infection, uterine damage were discussed and her questions were answered. Eda Royal interpreted. She is to be NPO after midnight the day of surgery.

## 2011-02-06 NOTE — Anesthesia Postprocedure Evaluation (Signed)
  Anesthesia Post-op Note  Patient: Janice Bernard  Procedure(s) Performed:  DILATATION AND EVACUATION (D&E)  Patient is awake and responsive. Pain and nausea are reasonably well controlled. Vital signs are stable and clinically acceptable. Oxygen saturation is clinically acceptable. There are no apparent anesthetic complications at this time. Patient is ready for discharge.

## 2011-02-06 NOTE — Anesthesia Preprocedure Evaluation (Addendum)
Anesthesia Evaluation  Name, MR# and DOB Patient awake  General Assessment Comment  Reviewed: Allergy & Precautions, H&P , Patient's Chart, lab work & pertinent test results, reviewed documented beta blocker date and time   History of Anesthesia Complications Negative for: history of anesthetic complications  Airway Mallampati: II TM Distance: >3 FB Neck ROM: full    Dental No notable dental hx.    Pulmonary  clear to auscultation  pulmonary exam normalPulmonary Exam Normal breath sounds clear to auscultation none    Cardiovascular Exercise Tolerance: Good regular Normal    Neuro/Psych Negative Neurological ROS  Negative Psych ROS  GI/Hepatic/Renal negative GI ROS, negative Liver ROS, and negative Renal ROS (+)       Endo/Other  Negative Endocrine ROS (+)      Abdominal   Musculoskeletal   Hematology negative hematology ROS (+)   Peds  Reproductive/Obstetrics negative OB ROS    Anesthesia Other Findings             Anesthesia Physical Anesthesia Plan  ASA: I  Anesthesia Plan: MAC   Post-op Pain Management:    Induction:   Airway Management Planned:   Additional Equipment:   Intra-op Plan:   Post-operative Plan:   Informed Consent: I have reviewed the patients History and Physical, chart, labs and discussed the procedure including the risks, benefits and alternatives for the proposed anesthesia with the patient or authorized representative who has indicated his/her understanding and acceptance.   Dental Advisory Given  Plan Discussed with: CRNA and Surgeon  Anesthesia Plan Comments:        Anesthesia Quick Evaluation

## 2011-02-07 NOTE — Op Note (Signed)
NAME:  Janice Bernard, Janice Bernard NO.:  [id number]  MEDICAL RECORD NO.:  [id number]  LOCATION:  [facility]                          [facility]ILITY[facility]  PHYSICIAN:  Scheryl Darter, MD       DATE OF BIRTH:  09/02/1980  DATE OF PROCEDURE:  02/06/2011 DATE OF DISCHARGE:  02/06/2011                              OPERATIVE REPORT   PROCEDURE:  Suction dilation and curettage.  PREOPERATIVE DIAGNOSIS:  Missed abortion at [redacted] weeks gestation.  POSTOPERATIVE DIAGNOSIS:  Missed abortion at [redacted] weeks gestation.  SURGEON:  Scheryl Darter, MD.  ANESTHESIA:  MAC and local intracervical.  SPECIMEN:  Products of conception.  ESTIMATED BLOOD LOSS:  Less than 50 mL.  COMPLICATIONS:  None.  DRAINS:  None.  COUNTS:  Correct.  Of course, the patient gave written consent for suction, dilation and curettage after diagnosis was made by ultrasound of a missed abortion at 15 weeks' gestation.  Ultrasound showed 11 weeks' size fetal demise. She had previously had ultrasound placing her at 15 weeks' gestation. The patient presented with some cramping and bleeding.  The cervix was long and closed.  The patient's identification was confirmed.  She was brought to the OR and MAC anesthesia was induced.  She was placed in dorsal lithotomy position.  Exam revealed about a 10-week size uterus. Cervix long and closed.  Perineum and vagina were sterilely prepped and draped.  The bladder was drained with a red rubber catheter.  Speculum was inserted.  Marcaine 0.5% with 1:200,000 epinephrine was infiltrated, 6 mL for intracervical block.  Cervix was grasped with a single-tooth tenaculum.  Pratt dilators were used to dilate the cervix sufficiently to pass a 10-mm suction curette.  Suction curettage was done and products of conception were obtained.  The patient received IV Pitocin during the procedure.  There was minimal bleeding at the end of procedure.  Complete evacuation of the uterine cavity was assured.   Specimen was sent to pathology.  All instruments were removed. The patient tolerated the procedure well without complications.  The patient's blood type was O+.     Scheryl Darter, MD     JA/MEDQ  D:  02/06/2011  T:  02/07/2011  Job:  [id number]

## 2011-02-22 ENCOUNTER — Encounter (HOSPITAL_COMMUNITY): Payer: Self-pay | Admitting: Anesthesiology

## 2011-02-22 ENCOUNTER — Ambulatory Visit: Admit: 2011-02-22 | Payer: Self-pay | Admitting: Obstetrics & Gynecology

## 2011-02-22 ENCOUNTER — Encounter (HOSPITAL_COMMUNITY): Payer: Self-pay

## 2011-02-22 ENCOUNTER — Ambulatory Visit (HOSPITAL_COMMUNITY)
Admission: AD | Admit: 2011-02-22 | Discharge: 2011-02-23 | Disposition: A | Discharge status: Home / Self Care | Payer: Self-pay | Source: Ambulatory Visit | Attending: Obstetrics & Gynecology | Admitting: Obstetrics & Gynecology

## 2011-02-22 ENCOUNTER — Encounter (HOSPITAL_COMMUNITY)
Admission: AD | Disposition: A | Discharge status: Home / Self Care | Payer: Self-pay | Source: Ambulatory Visit | Attending: Obstetrics & Gynecology

## 2011-02-22 ENCOUNTER — Inpatient Hospital Stay (HOSPITAL_COMMUNITY): Payer: Self-pay

## 2011-02-22 ENCOUNTER — Inpatient Hospital Stay (HOSPITAL_COMMUNITY): Payer: Self-pay | Admitting: Anesthesiology

## 2011-02-22 DIAGNOSIS — O021 Missed abortion: Principal | ICD-10-CM | POA: Insufficient documentation

## 2011-02-22 HISTORY — PX: DILATION AND EVACUATION: SHX1459

## 2011-02-22 LAB — DIFFERENTIAL
Basophils Absolute: 0.1 10*3/uL (ref 0.0–0.1)
Lymphocytes Relative: 31 % (ref 12–46)
Monocytes Absolute: 0.3 10*3/uL (ref 0.1–1.0)
Neutro Abs: 5 10*3/uL (ref 1.7–7.7)

## 2011-02-22 LAB — URINALYSIS, ROUTINE W REFLEX MICROSCOPIC
Bilirubin Urine: NEGATIVE
Glucose, UA: NEGATIVE mg/dL
Specific Gravity, Urine: 1.025 (ref 1.005–1.030)
pH: 6 (ref 5.0–8.0)

## 2011-02-22 LAB — URINE MICROSCOPIC-ADD ON

## 2011-02-22 LAB — WET PREP, GENITAL: Yeast Wet Prep HPF POC: NONE SEEN

## 2011-02-22 LAB — CBC
HCT: 37.1 % (ref 36.0–46.0)
Platelets: 185 10*3/uL (ref 150–400)
RDW: 13.3 % (ref 11.5–15.5)
WBC: 8.3 10*3/uL (ref 4.0–10.5)

## 2011-02-22 LAB — HCG, QUANTITATIVE, PREGNANCY: hCG, Beta Chain, Quant, S: 5 m[IU]/mL — ABNORMAL HIGH (ref <5)

## 2011-02-22 SURGERY — DILATION AND EVACUATION, UTERUS
Anesthesia: Monitor Anesthesia Care | Site: Vagina | Wound class: Clean Contaminated

## 2011-02-22 MED ORDER — BUPIVACAINE-EPINEPHRINE 0.5% -1:200000 IJ SOLN
INTRAMUSCULAR | Status: DC | PRN
  Administered 2011-02-22: 7 mL

## 2011-02-22 MED ORDER — FAMOTIDINE IN NACL 20-0.9 MG/50ML-% IV SOLN
INTRAVENOUS | Status: AC
  Administered 2011-02-22: 21:00:00
  Filled 2011-02-22: qty 50

## 2011-02-22 MED ORDER — KETOROLAC TROMETHAMINE 30 MG/ML IJ SOLN
INTRAMUSCULAR | Status: AC
  Filled 2011-02-22: qty 1

## 2011-02-22 MED ORDER — LACTATED RINGERS IV SOLN
INTRAVENOUS | Status: DC
  Administered 2011-02-22 (×2): via INTRAVENOUS

## 2011-02-22 MED ORDER — OXYCODONE-ACETAMINOPHEN 5-325 MG PO TABS
1.0000 | ORAL_TABLET | Freq: Once | ORAL | Status: AC
  Administered 2011-02-22: 1 via ORAL
  Filled 2011-02-22: qty 1

## 2011-02-22 MED ORDER — LIDOCAINE HCL (CARDIAC) 20 MG/ML IV SOLN
INTRAVENOUS | Status: DC | PRN
  Administered 2011-02-22: 60 mg via INTRAVENOUS

## 2011-02-22 MED ORDER — ONDANSETRON HCL 4 MG/2ML IJ SOLN
INTRAMUSCULAR | Status: DC | PRN
  Administered 2011-02-22: 4 mg via INTRAVENOUS

## 2011-02-22 MED ORDER — FENTANYL CITRATE 0.05 MG/ML IJ SOLN
50.0000 ug | Freq: Once | INTRAMUSCULAR | Status: AC
  Administered 2011-02-23: 50 ug via INTRAVENOUS

## 2011-02-22 MED ORDER — FENTANYL CITRATE 0.05 MG/ML IJ SOLN
INTRAMUSCULAR | Status: DC | PRN
  Administered 2011-02-22: 100 ug via INTRAVENOUS

## 2011-02-22 MED ORDER — PROPOFOL 10 MG/ML IV EMUL
INTRAVENOUS | Status: DC | PRN
  Administered 2011-02-22 (×2): 50 mg via INTRAVENOUS

## 2011-02-22 MED ORDER — KETOROLAC TROMETHAMINE 30 MG/ML IJ SOLN
30.0000 mg | Freq: Once | INTRAMUSCULAR | Status: AC
  Administered 2011-02-22: 30 mg via INTRAVENOUS

## 2011-02-22 MED ORDER — KETOROLAC TROMETHAMINE 30 MG/ML IJ SOLN
INTRAMUSCULAR | Status: DC | PRN
  Administered 2011-02-22: 30 mg via INTRAVENOUS

## 2011-02-22 MED ORDER — CITRIC ACID-SODIUM CITRATE 334-500 MG/5ML PO SOLN
ORAL | Status: AC
  Administered 2011-02-22: 30 mL
  Filled 2011-02-22: qty 15

## 2011-02-22 MED ORDER — DIPHENHYDRAMINE HCL 50 MG/ML IJ SOLN
INTRAMUSCULAR | Status: AC
  Filled 2011-02-22: qty 1

## 2011-02-22 MED ORDER — DIPHENHYDRAMINE HCL 50 MG/ML IJ SOLN: 25.0000 mg | Freq: Once | INTRAMUSCULAR | Status: DC

## 2011-02-22 MED ORDER — MIDAZOLAM HCL 5 MG/5ML IJ SOLN
INTRAMUSCULAR | Status: DC | PRN
  Administered 2011-02-22: 2 mg via INTRAVENOUS

## 2011-02-22 MED ORDER — DOXYCYCLINE HYCLATE 100 MG PO TABS: 100.0000 mg | ORAL_TABLET | Freq: Two times a day (BID) | ORAL | Status: DC

## 2011-02-22 SURGICAL SUPPLY — 21 items
CATH ROBINSON RED A/P 16FR (CATHETERS) ×2 IMPLANT
CLOTH BEACON ORANGE TIMEOUT ST (SAFETY) ×2 IMPLANT
DECANTER SPIKE VIAL GLASS SM (MISCELLANEOUS) IMPLANT
DRAPE UTILITY XL STRL (DRAPES) ×2 IMPLANT
GLOVE BIO SURGEON STRL SZ 6.5 (GLOVE) ×2 IMPLANT
GLOVE BIOGEL PI IND STRL 7.0 (GLOVE) ×2 IMPLANT
GLOVE BIOGEL PI INDICATOR 7.0 (GLOVE) ×2
GOWN PREVENTION PLUS LG XLONG (DISPOSABLE) ×4 IMPLANT
KIT BERKELEY 1ST TRIMESTER 3/8 (MISCELLANEOUS) IMPLANT
NDL SPNL 22GX3.5 QUINCKE BK (NEEDLE) IMPLANT
NEEDLE SPNL 22GX3.5 QUINCKE BK (NEEDLE) IMPLANT
PACK VAGINAL MINOR WOMEN LF (CUSTOM PROCEDURE TRAY) ×2 IMPLANT
PAD PREP 24X48 CUFFED NSTRL (MISCELLANEOUS) ×2 IMPLANT
SET BERKELEY SUCTION TUBING (SUCTIONS) IMPLANT
SYR CONTROL 10ML LL (SYRINGE) IMPLANT
TOWEL OR 17X24 6PK STRL BLUE (TOWEL DISPOSABLE) ×4 IMPLANT
VACURETTE 10 RIGID CVD (CANNULA) IMPLANT
VACURETTE 7MM CVD STRL WRAP (CANNULA) ×1 IMPLANT
VACURETTE 8 RIGID CVD (CANNULA) IMPLANT
VACURETTE 9 RIGID CVD (CANNULA) IMPLANT
WATER STERILE IRR 1000ML POUR (IV SOLUTION) ×2 IMPLANT

## 2011-02-22 NOTE — Progress Notes (Signed)
Pt states via [place], Spanish interpreter that she had d and c  2 weeks ago, has had pain since then in lower abdomen, gradually worsened. Has pink odorous vaginal d/c, did have intercourse Sunday, not painful. D/C began 3 days ago, did not use protection.

## 2011-02-22 NOTE — Progress Notes (Signed)
PAIN LEVEL 9/10 CRAMPING, DR. Jean Rosenthal NOTIFIED, ORDERED TORADOL 30 IV AWARE PATIENT ALREADY RECEIVED TORADOL 30 IV IN OR

## 2011-02-22 NOTE — Progress Notes (Signed)
Procedure: Suction dilation and curettage  Preop diagnosis: Possible retained products of conception after dilation and curettage  Postop diagnosis: Retained products of conception  Surgeon Dr. James Arnold  Anesthesia: MAC with local cervical block  Specimen: Parts of conception  Consultations: None  Counts: Correct  Drains: None  Patient gave written consent for a dilation and curettage due to possible retained products of conception. Patient had a suction and dilation and curettage on August 15 2 due to fetal demise at [redacted] weeks gestation. Patient identification was confirmed and she was brought to the operating room and MAC anesthesia was induced. She was placed in dorsal lithotomy position. The perineum and vagina were sterilely prepped and draped. Exam revealed about a 6 week size uterus the cervix was closed. The bladder was drained with a red rubber catheter. Speculum was inserted and the cervix was visualized. 7 mL of half percent Marcaine with 1 200,000 epinephrine was infiltrated. The uterus sounded to 8 cm. The cervix was dilated sufficiently to pass an 8 mm suction curette. Suction curettage was performed and a small amount of products of conception were obtained. Complete evacuation of the uterine cavity was assured. All instruments were removed. Patient tolerated the procedure well without complications. She was brought in stable condition to recovery room. Estimated blood loss was 25 mL. Counts were correct.      

## 2011-02-22 NOTE — Transfer of Care (Signed)
  Anesthesia Post-op Note  Patient: Janice Bernard  Procedure(s) Performed:  DILATATION AND EVACUATION (D&E)  Patient Location: PACU  Anesthesia Type: MAC  Level of Consciousness: sedated and patient cooperative  Airway and Oxygen Therapy: Patient Spontanous Breathing  Post-op Pain: none  Post-op Assessment: Post-op Vital signs reviewed  Post-op Vital Signs: Reviewed and stable  Complications: No apparent anesthesia complications

## 2011-02-22 NOTE — Addendum Note (Signed)
Addendum  created 02/22/11 0912 by Carlon Chaloux L. Oviya Ammar   Modules edited:Charting, Inpatient Notes    

## 2011-02-22 NOTE — Progress Notes (Signed)
Pt complained of itching all over including the top of her head-no rash present

## 2011-02-22 NOTE — Progress Notes (Signed)
Dr Debroah Loop discussin plan of car with the pt-discussing doing a D&C-Spanish intrep; present with questions answered by the MD

## 2011-02-22 NOTE — Progress Notes (Signed)
Note the itching is almost gone

## 2011-02-22 NOTE — Anesthesia Postprocedure Evaluation (Signed)
  Anesthesia Post-op Note  Patient: Janice Bernard  Procedure(s) Performed:  DILATATION AND EVACUATION (D&E)  Patient is awake and responsive. Pain and nausea are reasonably well controlled. Vital signs are stable and clinically acceptable. Oxygen saturation is clinically acceptable. There are no apparent anesthetic complications at this time. Patient is ready for discharge.   

## 2011-02-22 NOTE — ED Provider Notes (Signed)
History     Chief Complaint  Patient presents with  . Vaginal Discharge   HPI Comments: Patient is 29-yo female, G2P1011, with history of D&E approximately 2 weeks ago (02/06/11) for 15-week pregnancy loss. She presents with increase in her vaginal bleeding, with a foul smell, as well as increase in her abdominal pain. Patient states that for about one week after the D&E, she had bright red bleeding like a period, then it change to scant brown to black vaginal discharge. On Sunday (5 days ago), she had unprotected intercourse with her husband. She states that 2 days later (Tuesday, 3 days ago), her abdominal pain increased, and the vaginal discharge returned to bright red bleeding with a foul odor. She denies fever, chills, nausea or vomiting. She states the abdominal pain is pretty bad, that when she tries to urinate or have a BM it hurts more, and she has to "hold her abdomen" when she is going. She had a BM today, and denies that she is constipated. She was given doxycycline and flagyl after the D&E; she took all but one of the doxycycline but did not take many of the flagyl b/c she felt like it was making her sick.   Past Medical History  Diagnosis Date  . No pertinent past medical history   . Gestational diabetes     diet control    Past Surgical History  Procedure Date  . Cesarean section     Family History  Problem Relation Age of Onset  . Depression Paternal Grandfather   . Diabetes Paternal Uncle     History  Substance Use Topics  . Smoking status: Never Smoker   . Smokeless tobacco: Never Used  . Alcohol Use: 1.2 oz/week    2 Cans of beer per week    Allergies: No Known Allergies  Prescriptions prior to admission  Medication Sig Dispense Refill  . doxycycline (VIBRAMYCIN) 100 MG capsule Take 100 mg by mouth 2 (two) times daily.        . metroNIDAZOLE (FLAGYL) 500 MG tablet Take 500 mg by mouth 3 (three) times daily.        . prenatal vitamin w/FE, FA (PRENATAL 1 +  1) 27-1 MG TABS Take 2 tablets by mouth daily.          Review of Systems  Constitutional: Negative for fever and chills.  HENT: Negative.   Eyes: Negative.   Respiratory: Negative for cough, shortness of breath and wheezing.   Cardiovascular: Negative for chest pain and palpitations.  Gastrointestinal: Positive for abdominal pain. Negative for heartburn, nausea, vomiting, diarrhea, constipation and blood in stool.  Genitourinary: Positive for dysuria.       As per HPI  Musculoskeletal: Negative.   Skin: Negative.   Neurological: Negative.   Psychiatric/Behavioral: Negative.    Physical Exam   Blood pressure 114/74, pulse 84, temperature 99 F (37.2 C), temperature source Oral, resp. rate 16, height 4' 11.25" (1.505 m), weight 49.17 kg (108 lb 6.4 oz), last menstrual period 02/08/2011, SpO2 100.00%, not currently breastfeeding.  Physical Exam  Constitutional: She appears well-developed and well-nourished. No distress.  HENT:  Head: Normocephalic.  Eyes: Pupils are equal, round, and reactive to light.  Neck: Normal range of motion.  Cardiovascular: Normal rate, regular rhythm, normal heart sounds and intact distal pulses.   No murmur heard. Respiratory: Effort normal and breath sounds normal. No respiratory distress. She has no wheezes. She has no rales.  GI: Soft. Bowel sounds are normal.  There is tenderness. There is guarding.       Tenderness and guarding noted on exam  Genitourinary: Vaginal discharge found.       Tenderness of uterine outline/fundus, palpated to 12-15 wk size, no adenexal mass or tenderness  Neurological: She is alert. No cranial nerve deficit.  Skin: Skin is warm and dry. She is not diaphoretic. No erythema.  Psychiatric: She has a normal mood and affect.  pelvic exam repeated, [place] report reviewed, Uterus 6 week size, moderately tender, no mass. JGA Vaginal discharge seen is bright red, somewhat also with characteristics of yellow discharge, strong odor,  and coming from cervical os.  *RADIOLOGY REPORT*  Clinical Data: Pelvic pain and vaginal bleeding, dilatation and  extraction for missed abortion/intrauterine fetal demise 02/06/2011  TRANSABDOMINAL AND TRANSVAGINAL ULTRASOUND OF PELVIS  Technique: Both transabdominal and transvaginal ultrasound  examinations of the pelvis were performed. Transabdominal technique  was performed for global imaging of the pelvis including uterus,  ovaries, adnexal regions, and pelvic cul-de-sac.  Comparison: OB ultrasound 02/06/2011  It was necessary to proceed with endovaginal exam following the  transabdominal exam to visualize the endometrium.  Findings:  Uterus: 8.4 x 5.3 x 4.6 cm. Anteverted, anteflexed. No focal  myometrial abnormality.  Endometrium: Inhomogeneously echogenic, 1.5 cm. No focal internal  color flow is identified.No intrauterine gestational sac  identified.  Right ovary: Normal appearance/no adnexal mass  Left ovary: Normal appearance/no adnexal mass  Other findings: No free fluid  IMPRESSION:  Inhomogeneously thickened endometrium, which could indicate  retained products of conception or blood product/debris. No  intrauterine gestational sac identified.  Original Report Authenticated By: Harrel Lemon, M.D.      Imaging     US Transvaginal Non-OB (Order #[id number]) on 02/22/2011 - Imaging Information         Imaging      MAU Course  Procedures  MDM US Pelvic Exam Blood work  Assessment and Plan  Possible retained products of conception. I offered D&C, procedure and risks discussed and questions answered via interpreter. The risk of ansesthesia and bleeding, infection and uterine damage were discussed.  Chancy Milroy 02/22/2011, 6:14 PM

## 2011-02-22 NOTE — Anesthesia Preprocedure Evaluation (Signed)
Anesthesia Evaluation  Name, MR# and DOB Patient awake  General Assessment Comment  Reviewed: Allergy & Precautions, H&P , Patient's Chart, lab work & pertinent test results, reviewed documented beta blocker date and time   Airway Mallampati: II TM Distance: >3 FB Neck ROM: full    Dental No notable dental hx.    Pulmonary  clear to auscultation  pulmonary exam normalPulmonary Exam Normal breath sounds clear to auscultation none    Cardiovascular regular Normal    Neuro/Psych   GI/Hepatic/Renal   Endo/Other    Abdominal   Musculoskeletal   Hematology   Peds  Reproductive/Obstetrics    Anesthesia Other Findings             Anesthesia Physical Anesthesia Plan  ASA: I and Emergent  Anesthesia Plan: General   Post-op Pain Management:    Induction: Intravenous  Airway Management Planned: Oral ETT  Additional Equipment:   Intra-op Plan:   Post-operative Plan:   Informed Consent: I have reviewed the patients History and Physical, chart, labs and discussed the procedure including the risks, benefits and alternatives for the proposed anesthesia with the patient or authorized representative who has indicated his/her understanding and acceptance.   Dental Advisory Given  Plan Discussed with: CRNA and Surgeon  Anesthesia Plan Comments: (  Discussed l anesthesia, including possible nausea, instrumentation of airway, sore throat,pulmonary aspiration, etc. I asked if the were any outstanding questions, or  concerns before we proceeded. )        Anesthesia Quick Evaluation

## 2011-02-22 NOTE — Addendum Note (Signed)
Addendum  created 02/22/11 0912 by Amy L. Cassidy   Modules edited:Charting, Inpatient Notes

## 2011-02-23 ENCOUNTER — Other Ambulatory Visit: Payer: Self-pay | Admitting: Obstetrics & Gynecology

## 2011-02-23 LAB — GC/CHLAMYDIA PROBE AMP, GENITAL: Chlamydia, DNA Probe: NEGATIVE

## 2011-02-23 MED ORDER — FENTANYL CITRATE 0.05 MG/ML IJ SOLN
INTRAMUSCULAR | Status: AC
  Filled 2011-02-23: qty 2

## 2011-02-25 ENCOUNTER — Inpatient Hospital Stay (HOSPITAL_COMMUNITY)
Admission: AD | Admit: 2011-02-25 | Discharge: 2011-02-25 | Disposition: A | Discharge status: Home / Self Care | Payer: Self-pay | Source: Ambulatory Visit | Attending: Obstetrics and Gynecology | Admitting: Obstetrics and Gynecology

## 2011-02-25 DIAGNOSIS — G8918 Other acute postprocedural pain: Secondary | ICD-10-CM | POA: Insufficient documentation

## 2011-02-25 DIAGNOSIS — K5900 Constipation, unspecified: Secondary | ICD-10-CM | POA: Insufficient documentation

## 2011-02-25 MED ORDER — FLEET ENEMA 7-19 GM/118ML RE ENEM
1.0000 | ENEMA | Freq: Once | RECTAL | Status: AC
  Administered 2011-02-25: 1 via RECTAL

## 2011-02-25 MED ORDER — IBUPROFEN 600 MG PO TABS: 600.0000 mg | ORAL_TABLET | Freq: Four times a day (QID) | ORAL | Status: AC | PRN

## 2011-02-25 NOTE — Progress Notes (Signed)
Pt states that she can't pee or poop-states the last time she urinated was 1600-and last time she has a BM was last 02/22/2011

## 2011-02-25 NOTE — Progress Notes (Signed)
Bladder scan done-15ml in bladder

## 2011-02-25 NOTE — ED Provider Notes (Signed)
History     CSN: [id number] Arrival date & time: 02/25/2011  7:17 PM  Chief Complaint  Patient presents with  . Constipation   HPI  Last BM 3 days ago with straining and hard stool. Now unable to have BM despite taking an OTC oral preparation to relieve constipation; she thinks it was MOM. Has cramping abd pain. Last voided 4 hrs ago and not able to void now. She is post op D&E on 02/06/11 for missed Ab of 11 wk size fetus, then another D&E on 02/22/11 for RPOC. Having scant brown bleeding.  Past Medical History  Diagnosis Date  . No pertinent past medical history   . Gestational diabetes     diet control    Past Surgical History  Procedure Date  . Cesarean section   . Dilation and curettage of uterus     Family History  Problem Relation Age of Onset  . Depression Paternal Grandfather   . Diabetes Paternal Uncle     History  Substance Use Topics  . Smoking status: Never Smoker   . Smokeless tobacco: Never Used  . Alcohol Use: 1.2 oz/week    2 Cans of beer per week    OB History    Grav Para Term Preterm Abortions TAB SAB Ect Mult Living   2 1 1  0 1 0 1 0 0 1      Review of Systems Negative for dysuria, dysparunia, diarrhea. No fever, chills, back pain.   Physical Exam  BP 106/71  Pulse 88  Temp(Src) 98.7 F (37.1 C) (Oral)  Resp 16  Ht 5' (1.524 m)  Wt 49.986 kg (110 lb 3.2 oz)  BMI 21.52 kg/m2  LMP 02/08/2011  Physical Exam Gen: NAD Abd: soft, flat, NT. Pelvic: She refuses speculum exam. On bimanual, uterus small mobile, NT. Cx closed. Scant brown blood. Rectum full of stool.  ED Course  Procedures Bladder scan: 137 cc Had good result from [facility] and also has voided.    Assessment:  Constipation Normal post op day#3 s/p  D&E  P: Reviewed constipation measures. Stop PNV for now. Use condoms. F/U GYN clinic in 4 weeks.   Gokul Waybright 10:04 PM

## 2011-02-25 NOTE — Progress Notes (Signed)
Pt post D and C on 8/14 and 8/30 for miscarriage and retained products.  Pt LBM 8/30, having abd pain, no results after taking a laxative at home.

## 2011-02-28 SURGERY — DILATION AND EVACUATION, UTERUS
Anesthesia: Monitor Anesthesia Care

## 2011-03-01 NOTE — Op Note (Signed)
Procedure: Suction dilation and curettage  Preop diagnosis: Possible retained products of conception after dilation and curettage  Postop diagnosis: Retained products of conception  Surgeon Dr. Scheryl Darter  Anesthesia: MAC with local cervical block  Specimen: Parts of conception  Consultations: None  Counts: Correct  Drains: None  Patient gave written consent for a dilation and curettage due to possible retained products of conception. Patient had a suction and dilation and curettage on August 15 2 due to fetal demise at [redacted] weeks gestation. Patient identification was confirmed and she was brought to the operating room and MAC anesthesia was induced. She was placed in dorsal lithotomy position. The perineum and vagina were sterilely prepped and draped. Exam revealed about a 6 week size uterus the cervix was closed. The bladder was drained with a red rubber catheter. Speculum was inserted and the cervix was visualized. 7 mL of half percent Marcaine with 1 200,000 epinephrine was infiltrated. The uterus sounded to 8 cm. The cervix was dilated sufficiently to pass an 8 mm suction curette. Suction curettage was performed and a small amount of products of conception were obtained. Complete evacuation of the uterine cavity was assured. All instruments were removed. Patient tolerated the procedure well without complications. She was brought in stable condition to recovery room. Estimated blood loss was 25 mL. Counts were correct.

## 2011-03-05 ENCOUNTER — Encounter (HOSPITAL_COMMUNITY): Payer: Self-pay | Admitting: Obstetrics & Gynecology

## 2011-03-06 ENCOUNTER — Encounter (HOSPITAL_COMMUNITY): Payer: Self-pay | Admitting: Obstetrics & Gynecology

## 2011-03-12 ENCOUNTER — Ambulatory Visit: Payer: Self-pay | Admitting: Obstetrics & Gynecology

## 2011-03-12 ENCOUNTER — Ambulatory Visit (INDEPENDENT_AMBULATORY_CARE_PROVIDER_SITE_OTHER): Payer: Self-pay | Admitting: Obstetrics & Gynecology

## 2011-03-12 ENCOUNTER — Encounter: Payer: Self-pay | Admitting: Obstetrics & Gynecology

## 2011-03-12 VITALS — BP 112/80 | HR 79 | Temp 98.0°F | Wt 105.9 lb

## 2011-03-12 DIAGNOSIS — Z09 Encounter for follow-up examination after completed treatment for conditions other than malignant neoplasm: Secondary | ICD-10-CM

## 2011-03-12 DIAGNOSIS — Z309 Encounter for contraceptive management, unspecified: Secondary | ICD-10-CM

## 2011-03-12 NOTE — Patient Instructions (Addendum)
Cuidados preventivos en las mujeres adultas (Preventative Care for Adults - Female) Algunos estudios han demostrado que actualmente la mitad de las muertes en los [place] se deben a un estilo de vida poco saludable. En l se incluye el hecho de ignorar las indicaciones de un cuidado preventivo. Las pautas preventivas de salud para las mujeres incluyen las siguientes prcticas clave:   Un examen fsico anual de rutina es una buena manera de consultar con su mdico de cabecera sobre su salud y controles preventivos. Es la oportunidad de compartir las preocupaciones y novedades sobre su salud, y realizarse un examen a fondo.   Si usted fuma cigarrillos, consulte con su mdico cmo dejarlo. Esto, literalmente, puede salvar su vida, no importa cunto tiempo haya sido consumidor de tabaco. Si no lo hace, no comience.   Mantenga una dieta saludable y un peso normal. El aumento de peso conduce a problemas con la presin arterial y a la diabetes. Disminuya el consumo de grasas de la dieta y aumente el ejercicio. Coma una variedad de alimentos, incluyendo frutas, verduras, protena animal y vegetal (carne, pescado, pollo y huevos, o frijoles, lentejas y queso de soja) y granos, como el arroz. Si es necesario, pdale una dieta adecuada al profesional que lo asiste.   El ejercicio aerbico ayuda a mantener la buena salud del corazn. Los CDC y el American College [employer] recomiendan 30 minutos de ejercicio de intensidad moderada (caminar a paso ligero que aumenta su ritmo cardaco y la respiracin) en casi todos los das de la semana. La presin arterial elevada que persiste debe tratarse con medicamentos si la prdida de peso y el ejercicio no son efectivos.   Evite el fumar y el beber en exceso (ms de dos copas por da) o el uso de drogas callejeras. No comparta agujas. Pida ayuda si necesita asistencia o instrucciones con respecto a abandonar el consumo de alcohol, cigarrillos o drogas.    Mantenga los lpidos colesterol en sangre normales reduciendo al mnimo su consumo de grasas saturadas. Consuma una dieta balanceada con frutas y verduras en cantidad. El [employer] alienta a las mujeres a comer 5-9 porciones de frutas y verduras cada da. El profesional que lo asiste podr darle instrucciones para ayudarla a mantener un nivel bajo de riesgo de contraer enfermedades cardacas o infarto. La presin arterial elevada causa problemas cardacos y aumenta el riesgo de ictus. La presin arterial debe controlarse cada 1-2 aos, de 20 aos de edad en adelante.   Los anlisis de sangre para el colesterol alto, que causan enfermedades de corazn y los vasos sanguneos, deben comenzar a los 20 aos y repetirse cada 5 aos, si los resultados son normales. (Repetir las pruebas con ms frecuencia si los resultados son altos.)   El control de diabetes consiste en tomar una muestra de sangre para comprobar su nivel de azcar en la sangre, despus de un perodo de ayuno. Esto se hace una vez cada 3 aos, despus de los 45 aos de edad, si los resultados son normales.   La deteccin del cncer de mama es fundamental para el cuidado preventivo de las mujeres. Todas las mujeres de 20 aos o ms deben realizar un autoexamen de los senos cada mes. A la edad de 40 aos o ms, las mujeres deben realizarse un examen de mama cada ao. Entre los 40 y los 50 aos, las mujeres deben realizarse una mamografa (radiografa) de los senos cada ao. El profesional le indicar cundo comenzar   sus mamografas anuales.   El control del cncer cervical incluye un PAP (muestra de clulas que se examinan bajo un microscopio) del cuello uterino (extremo del tero). Tambin incluye la prueba para el VPH (virus del papiloma humano, que puede causar cncer de cuello uterino). El control y examen plvico debe comenzar a los 21 aos, o 3 aos despus de que una mujer sea sexualmente activa. La inspeccin debe  realizarse cada ao, con una prueba de Papanicolaou pero no de VPH, hasta los 30 aos de edad. Despus de los 30 aos de edad, debe hacerse un Papanicolau cada 3 aos con prueba de VPH, si no se ha detectado VPH con anterioridad.   El cncer de colon se puede detectar y con frecuencia prevenir, mucho antes de que sea peligroso para la vida. La mayora de los controles de rutina del cncer de colon comienza a los 50 aos. Sobre una base anual, su mdico puede proporcionar pruebas de fcil utilizacin para llevar a cabo en casa para detectar sangre oculta en las heces. El uso de una pequea cmara en el extremo de un tubo, para examinar directamente el colon (sigmoidoscopia o colonoscopia), puede detectar las primeras formas de cncer de colon y puede salvar la vida. Hable con su mdico acerca de esto a los 50 aos, cuando debe comenzar con la investigacin de rutina. (El control se repite cada 5 aos, a menos que se encuentren formas de plipos pre-cancerosos o tumores pequeos.)   Practicar sexo seguro. Use preservativos. Los condones se utilizan para el control de la natalidad y para reducir la propagacin de las infecciones de transmisin sexual (ETS). El sexo no seguro es la actividad sexual sin proteccin, como preservativos y el evitar realizar actos de alto riesgo, para reducir las probabilidades de contraer o propagar enfermedades de transmisin sexual. Algunas de estas enfermedades son: gonorrea, clamidia, sfilis, tricomonas, herpes, VPH (virus del papiloma humano) y VIH (virus de inmunodeficiencia humana) que causa el virus del SIDA. El herpes, el VIH y el VPH son enfermedades virales que no tienen cura. Pueden producir discapacidad, cncer y hasta la muerte.   El VPH causa cncer del cuello del tero, y otras infecciones que pueden transmitirse de persona a persona Existe una vacuna para el VPH y las mujeres deben recibir la vacuna entre las edades de 11 y 26 aos. Se requiere una serie de tres  inyecciones.   La osteoporosis es una enfermedad en la cual los huesos pierden minerales y fuerza a medida que envejecemos. Esto puede dar lugar a fracturas seas graves. El riesgo de osteoporosis puede identificarse mediante una densitometra sea. Las mujeres de ms de 65 aos deben consultar con su mdico, al igual que las mujeres post menopausia que tienen otros factores de riesgo. Pregunte a su mdico si debe tomar un suplemento de calcio y vitamina D, para reducir la osteoporosis.   La menopausia puede estar asociada con sntomas fsicos y los riesgos. Se encuentre disponible una terapia de reemplazo de hormonas para diminuirlos. Consulte a su mdico para saber si la toma de hormonas es conveniente para usted.   Use una pantalla solar con un factor SPF de 15 o mayor. Aplique protector solar en abundancia y en varias ocasiones durante el da. Permanecer bajo el sol cuando la sombra que ocasiona el sol es ms chica que la persona, significa que est expuesto a una mayor intensidad. Las personas que se broncean con lmpara tienen un riesgo mayor de sufrir cncer de piel. Use gafas de   sol, para proteger sus ojos del dao de la [place] solar (que puede acelerar la formacin de cataratas).   Una vez al mes, hgase un examen la piel del cuerpo entero o de revisin, utilizando un espejo para mirar a su espalda. Notifique al profesional que lo asiste si observa modificaciones en lunares, especialmente en su forma, color, si el tamao es mayor que el de una goma de borrar, o aparecen nuevos lunares.   Mantenga siempre en funcionamiento los detectores de monxido de carbono y de humo dentro del hogar. Cambie las bateras cada seis meses, o utilice un modelo que se enchufe en la pared.   Mantngase al da con sus vacunas contra el ttanos y otras vacunas de rutina. Un refuerzo para el ttanos se debe dar cada 10 aos. Asegrese de aplicarse la vacuna contra la gripe cada ao, dado que el 5% -20% de la poblacin de  [place] se enferma debido a la gripe. La composicin de la vacuna contra la gripe cambia cada ao, por lo que ser vacunados una vez no es suficiente. Vacnese en el otoo, antes del pico de la temporada de gripe. La siguiente tabla muestra las vacunas importantes. Otras vacunas a considerar incluyen el virus de la hepatitis A (para prevenir una forma de infeccin del hgado por un virus adquirido de los alimentos), la varicela zoster (el virus que causa el herpes), y Meningococco (contra las bacterias que causan una forma de meningitis).   Lvese los dientes dos veces al da con pasta con fluoruro y utilice hilo dental diariamente. La buena higiene oral previene la caries dental y enfermedades de las encas, que puede ser dolorosa y puede causar otros problemas de salud. Visite a su dentista para una revisin de rutina oral y dental y cuidado preventivo cada 6-12 meses.   El ndice de [employer] o IMC es una manera de medir la cantidad de grasa de su cuerpo. Un IMC superior a 27 aumenta el riesgo de enfermedades cardacas, diabetes, hipertensin, infartos y otros problemas relacionados con la obesidad. El mdico puede ayudar a determinar su ndice de masa corporal, y puede desarrollar un programa de ejercicio y una dieta para ayudarle a alcanzar o mantener esta medida en un nivel saludable.   Use cinturn de seguridad cada vez que se encuentra en un vehculo, ya sea como pasajero o conductor, e incluso para los viajes cortos de unos pocos minutos.   Si utiliza bicicleta, use el casco todo el tiempo.  Cuidados preventivos en los mujeres adultas  Controles preventivos  Edad 19-39 Edad 40-64 Edad 65 o ms  Evaluacin de los riesgos para la salud y asesoramiento sobre el estilo de vida     Control de la presin arterial** Cada 1-2 aos Cada 1-2 aos Cada 1-2 aos  Colesterol total con HDL ** Cada 5 aos, comenzando a los 35 aos Cada 5 aos, comenzando a los 20, o con ms frecuencia si el riesgo es alto.  Cada 5 aos hasta los 75 aos, luego opcional  Autoexamen de mamas Cada mes en mujeres mayores de 20 aos. Cada mes Cada mes  Examen clnico de mamas** Cada 3 aos, comenzando a los 20 aos Todos los aos Todos los aos  Mamografa**   Todos los aos comenzando a los 50, opcional desde los 40-49 aos (convrselo con el profesional) Cada 5 aos hasta los 75 aos, luego opcional  Papanicolau** y control de VPH. Cada ao desde los 21 hasta los 29 aos Cada 3 aos a   partir de los 30 hasta los 65 si el VPH es negativo. Opcional; convrselo con el profesional  Sigmoidoscopa flexible** o colonoscopa**.   Cada 5 aos, comenzando a los 50 aos Cada 5 aos hasta los 80 aos, luego opcional  Prueba de sangre oculta en materia fecal   Cada 5 aos, comenzando a los 50 aos Todos los aos hasta los 80 aos, luego opcional  Autoexamen de piel Cada mes Cada mes Cada mes  Vacuna DT (difteria/ttanos) Cada 10 aos Cada 10 aos Cada 10 aos  Vacuna contra la gripe** Todos los aos Todos los aos Todos los aos  Vacuna contra la VPH Una vez entre 11 y los 26 aos de edad.     Vacuna antineumocccica** Opcional Opcional Cada 5 aos  Vacuna contra la hepatitis B** Serie de 3 vacunas. (si no se ha administrado previamente, por lo general se da a los 0, 1 a 2 y 4 a 6 meses).  Consulte con el profesional si no se la ha administrado. Consulte con el profesional si no se la ha administrado.  **La historia familiar y personal de riesgos y enfermedades puede cambiar las recomendaciones del mdico.  Document Released: 03/21/2005 Document Re-Released: 09/05/2009 [employer] Patient Information 2011 [employer]. 

## 2011-03-12 NOTE — Progress Notes (Signed)
  Subjective:     Janice Bernard is a 30 y.o. female who presents to the clinic 2 weeks status post D&E for retained placenta.  She had a D&E for 15 week missed abortion, complicated by retained placenta which necessiitated another D&E.  Eating a regular diet with difficulty. Bowel movements are normal. The patient is not having any pain.  She does reports having irregular bleeding which is normal after her miscarriage and surgery. No intercourse yet.  Feels appropriately sad.  The following portions of the patient's history were reviewed and updated as appropriate: allergies, current medications, past family history, past medical history, past social history, past surgical history and problem list.  Review of Systems A comprehensive review of systems was negative.    Objective:    BP 112/80  Pulse 79  Temp(Src) 98 F (36.7 C) (Oral)  Wt 105 lb 14.4 oz (48.036 kg)  LMP 02/08/2011 General:  alert and no distress  Abdomen: soft, bowel sounds active, non-tender, no abnormal masses  Pelvic:   NEFG. Small uterus, normal adnexa, no tenderness     Assessment:    Doing well postoperatively. Operative findings again reviewed. Pathology report discussed.  Pathology showed retained placenta.   Plan:    1. Stable after missed abortion complicated by retained placenta. 2. Preconceptual counseling given, she will use condoms for now for [facility] 3. Activity restrictions: none 4. Anticipated return to work: now. 5. Follow up for annual pap and exam

## 2011-03-23 LAB — WET PREP, GENITAL
Clue Cells Wet Prep HPF POC: NONE SEEN
Yeast Wet Prep HPF POC: NONE SEEN

## 2011-03-23 LAB — CBC
Hemoglobin: 12.8
Platelets: 231
RDW: 13.7

## 2011-03-23 LAB — URINALYSIS, ROUTINE W REFLEX MICROSCOPIC
Bilirubin Urine: NEGATIVE
Ketones, ur: NEGATIVE
Nitrite: NEGATIVE
Protein, ur: NEGATIVE
pH: 5.5

## 2011-03-23 LAB — GC/CHLAMYDIA PROBE AMP, GENITAL: Chlamydia, DNA Probe: NEGATIVE

## 2011-05-03 ENCOUNTER — Ambulatory Visit (INDEPENDENT_AMBULATORY_CARE_PROVIDER_SITE_OTHER): Payer: Self-pay | Admitting: Physician Assistant

## 2011-05-03 ENCOUNTER — Encounter: Payer: Self-pay | Admitting: Physician Assistant

## 2011-05-03 VITALS — BP 116/72 | HR 97 | Temp 98.9°F | Ht 62.0 in | Wt 109.7 lb

## 2011-05-03 DIAGNOSIS — Z01419 Encounter for gynecological examination (general) (routine) without abnormal findings: Secondary | ICD-10-CM

## 2011-05-03 DIAGNOSIS — O034 Incomplete spontaneous abortion without complication: Secondary | ICD-10-CM

## 2011-05-03 DIAGNOSIS — E079 Disorder of thyroid, unspecified: Secondary | ICD-10-CM

## 2011-05-03 LAB — T3, FREE: T3, Free: 2.6 pg/mL (ref 2.3–4.2)

## 2011-05-03 LAB — T4, FREE: Free T4: 1.19 ng/dL (ref 0.80–1.80)

## 2011-05-03 NOTE — Progress Notes (Signed)
  Subjective:     Janice Bernard is a 30 y.o. female and is here for a comprehensive physical exam. The patient reports no problems.Pts states hx of being on thyroid medications prior to pregnancy and med where d/c'd in pregnancy due to normal labs.  History   Social History  . Marital Status: Married    Spouse Name: N/A    Number of Children: N/A  . Years of Education: N/A   Occupational History  . Not on file.   Social History Main Topics  . Smoking status: Never Smoker   . Smokeless tobacco: Never Used  . Alcohol Use: 1.2 oz/week    2 Cans of beer per week  . Drug Use: No  . Sexually Active: Yes    Birth Control/ Protection: None   Other Topics Concern  . Not on file   Social History Narrative  . No narrative on file   Health Maintenance  Topic Date Due  . Pap Smear  04/23/2001  . Tetanus/tdap  04/23/2002  . Influenza Vaccine  03/26/2011    The following portions of the patient's history were reviewed and updated as appropriate: allergies, current medications, past family history, past medical history, past social history, past surgical history and problem list.  Review of Systems A comprehensive review of systems was negative.   Objective:    BP 116/72  Pulse 97  Temp(Src) 98.9 F (37.2 C) (Oral)  Ht 5\' 2"  (1.575 m)  Wt 109 lb 11.2 oz (49.76 kg)  BMI 20.06 kg/m2  LMP 04/02/2011 General appearance: alert, cooperative, appears stated age and no distress Breasts: normal appearance, no masses or tenderness, Inspection negative Abdomen: soft, non-tender; bowel sounds normal; no masses,  no organomegaly Pelvic: cervix normal in appearance, external genitalia normal, no adnexal masses or tenderness, no cervical motion tenderness, rectovaginal septum normal, uterus normal size, shape, and consistency, vagina normal without discharge and vaginal dryness Extremities: extremities normal, atraumatic, no cyanosis or edema    Assessment:    Healthy female exam.  ?  Hx of Throid Dz     Plan:     See After Visit Summary for Counseling Recommendations

## 2011-05-03 NOTE — Patient Instructions (Signed)
Papanicolau (Pap Test) El papanicolau consiste en la toma de una muestra de clulas del cuello del tero de una mujer. El cuello uterino es la abertura que comunica el tero con la vagina (canal de parto). Las clulas se obtienen durante el examen plvico. Las clulas se observan al microscopio para ver si son normales o se estn desarrollando clulas cancerosas, o existen modificaciones que indican que se desarrollar un cncer. La displasia cervical es un trastorno en el que una mujer tiene modificaciones anormales en las clulas del cuello uterino. Estos cambios son un signo precoz de que puede desarrollar un cncer cervical. Tambin puede detectarse el virus del papiloma humano (VPH) debido a que 4 tipos son responsables en el 70% de los casos de cncer cervical. Tambin puede detectar infecciones producidas por bacterias, hongos, protozoos y virus.  El cncer cervical es difcil de tratar y es menos probable que tenga un buen desenlace si no se trata. Detectar la enfermedad en sus estadios ms tempranos conduce a mejores resultados. Desde que se ha comenzado a realizar este estudio, hace 60 aos, las muertes por cncer cervical disminuyeron en un 70%. Todas las mujeres deben hacer un examen Papanicolau. LOS FACTORES DE RIESGO PARA EL CNCER CERVICAL SON:   Ser sexualmente activa antes de los 18 aos.   Ser hija de una mujer que tom dietilstilbestrol (DES) durante el embarazo.   Tener un compaero sexual que ha sufrido cncer de pene.   Tener un compaero sexual cuya pareja haya sufrido cncer cervical o displasia cervical (las primeras modificaciones que indican que se puede desarrollar el cncer.   Debilidad en el sistema inmunolgico. Por ejemplo sufrir VIH u otros problemas de inmunodeficiencia.   Haber sufrido infecciones de transmisin sexual, como clamidia, gonorrea o VPH.   Tener un Papanicolau anormal o cncer en la vagina o la vulva.   Tener ms de un compaero sexual.   Historia  de cncer cervical en la hermana o la madre.   No usar condones con un nuevo compaero sexual.   El consumo de cigarrillos.  QUIEN DEBE REALIZARSE UN PAPANICOLAU  Un Papanicolau se realiza para detectar cncer cervical.   El primer Papanicolaou debe realizarse los 21 aos.   Entre los 21 y los 29 aos debe repetirse cada dos aos.   Luego de los 30 aos, debe realizarse un Papanicolaou cada tres aos siempre que los 3 estudios anteriores sean normales.   Algunas mujeres sufren problemas mdicos que aumentan la probabilidad de contraer cncer cervical. Consulte a su mdico acerca de estos problemas. Es muy importante que le informe a su mdico si aparecen nuevos problemas poco despus de su ltimo Papanicolaou. En estos casos, el mdico podr indicar que se realice el Papanicolaou con ms frecuencia.   Estas recomendaciones son las mismas para todas las mujeres hayan recibido o no la vacuna para el VPH (virus del papiloma humano).   Si le han realizado una histerectoma por un problema que no era cncer u otra enfermedad que podra causar cncer, ya no necesitar un Papanicolaou. Sin embargo, aunque ya no necesite un Papanicolau, es una buena idea realizar un examen regular para asegurarse de que no hay otros problemas.    Si tiene entre 65 y 70 aos y ha tenido un Papanicolaou normal en los ltimos 10 aos, ya no ser necesario realizarlo. Sin embargo, aunque ya no necesite un Papanicolau, es una buena idea realizar un examen regular para asegurarse de que no hay otros problemas.      Si ha recibido un tratamiento para el cncer cervical o para una enfermedad que podra causar cncer, necesitar realizar un Papanicolaou y controles durante al menos 20 aos de concluir el tratamiento   Si no ha tenido continuidad para hacerse los Papanicolau, debern evaluarse nuevamente los factores de riesgo (como tener una nueva pareja sexual) para determinar si deben reanudarse los estudios.    Algunas mujeres necesitarn realizarse estudios con ms frecuencia si tienen factores de riesgo para el cncer cervical.  PREPARACIN PARA EL PAPANICOLAU Debe realizarse durante las semanas anteriores a la menstruacin. No deben hacerse duchas vaginales ni tener relaciones sexuales las 24 horas anteriores a la prueba. No colocarse cremas vaginales, diafragmas o tampones durante las 24 horas anteriores a la prueba. Para minimizar las molestias, podr vaciar la vejiga antes del examen. TOMA DE LA MUESTRA El profesional le har un examen plvico. Colocar un instrumento de metal o plstico (espculo) en la vagina. Esto se hace antes de que el mdico realice un examen bimanual de los rganos femeninos internos. Este instrumento le permite al mdico observar el interior de la vagina y mirar el cuello uterino. Para tomar la muestra de las clulas de la abertura interna del cuello uterino, se utiliza un pequeo cepillo estril. Para raspar la zona externa del cuello uterino se utiliza una esptula de madera. Ninguno de estos dos procedimientos le causar dolor. Las dos muestras se colocan en un portaobjetos de vidrio o en una pequea botella llena con un lquido especial. Luego las clulas se observarn en el microscopio de un laboratorio. Un especialista observar las clulas y determinar si son normales. RESULTADOS DEL PAPANICOLAU  Un Papanicolau normal no muestra clulas anormales ni evidencias de inflamacin.   La presencia de clulas que se desarrollan de manera anormal en la superficie del cuello uterino ser informada como un Papanicolau anormal. Se utilizan diferentes categoras de hallazgos. El profesional que la asiste comentar con usted la importancia de estos hallazgos. El profesional determinar cuales son los controles que deber seguir o cuando deber hacerse otro examen.   Si tiene dos  ms Pap anormales:   Le solicitarn que se haga una colposcopa. En esta prueba se observar el cervix  con un microscpico luminoso especial.   Tambin ser necesaria la toma de una muestra de tejido (biopsia). Se realiza tomando una pequea muestra de tejido. La muestra es observada en el microscopio para hallar la causa de las clulas anormales. Asegrese de retirar los resultados de su Papanicolau. Si no recibe los resultados dentro de las dos semanas, comunquese con el consultorio de su mdico. Recuerde que es su responsabilidad obtener los resultados y averiguar cuales son los controles que debe seguir.  Document Released: 11/28/2007 Document Revised: 02/21/2011 [employer] Patient Information 2012 [employer]. 

## 2011-07-04 ENCOUNTER — Emergency Department (HOSPITAL_COMMUNITY)
Admission: EM | Admit: 2011-07-04 | Discharge: 2011-07-05 | Disposition: A | Discharge status: Home / Self Care | Payer: Self-pay | Attending: Internal Medicine | Admitting: Internal Medicine

## 2011-07-04 ENCOUNTER — Emergency Department (HOSPITAL_COMMUNITY): Payer: Self-pay

## 2011-07-04 ENCOUNTER — Encounter (HOSPITAL_COMMUNITY): Payer: Self-pay | Admitting: *Deleted

## 2011-07-04 DIAGNOSIS — Z331 Pregnant state, incidental: Secondary | ICD-10-CM | POA: Insufficient documentation

## 2011-07-04 DIAGNOSIS — F3289 Other specified depressive episodes: Secondary | ICD-10-CM | POA: Insufficient documentation

## 2011-07-04 DIAGNOSIS — F329 Major depressive disorder, single episode, unspecified: Secondary | ICD-10-CM | POA: Insufficient documentation

## 2011-07-04 DIAGNOSIS — IMO0002 Reserved for concepts with insufficient information to code with codable children: Secondary | ICD-10-CM | POA: Insufficient documentation

## 2011-07-04 DIAGNOSIS — R109 Unspecified abdominal pain: Secondary | ICD-10-CM | POA: Insufficient documentation

## 2011-07-04 HISTORY — DX: Major depressive disorder, single episode, unspecified: F32.9

## 2011-07-04 HISTORY — DX: Depression, unspecified: F32.A

## 2011-07-04 LAB — DIFFERENTIAL
Basophils Absolute: 0 10*3/uL (ref 0.0–0.1)
Basophils Relative: 0 % (ref 0–1)
Neutro Abs: 8.1 10*3/uL — ABNORMAL HIGH (ref 1.7–7.7)
Neutrophils Relative %: 84 % — ABNORMAL HIGH (ref 43–77)

## 2011-07-04 LAB — PREGNANCY, URINE: Preg Test, Ur: POSITIVE

## 2011-07-04 LAB — POCT I-STAT, CHEM 8
BUN: 7 mg/dL (ref 6–23)
Chloride: 105 mEq/L (ref 96–112)
HCT: 38 % (ref 36.0–46.0)
Sodium: 139 mEq/L (ref 135–145)
TCO2: 22 mmol/L (ref 0–100)

## 2011-07-04 LAB — CBC
MCHC: 33.8 g/dL (ref 30.0–36.0)
RDW: 12.8 % (ref 11.5–15.5)

## 2011-07-04 LAB — URINALYSIS, ROUTINE W REFLEX MICROSCOPIC
Nitrite: NEGATIVE
Specific Gravity, Urine: 1.008 (ref 1.005–1.030)
Urobilinogen, UA: 0.2 mg/dL (ref 0.0–1.0)

## 2011-07-04 LAB — RAPID URINE DRUG SCREEN, HOSP PERFORMED
Barbiturates: NOT DETECTED
Benzodiazepines: NOT DETECTED

## 2011-07-04 LAB — HCG, QUANTITATIVE, PREGNANCY: hCG, Beta Chain, Quant, S: 6387 m[IU]/mL — ABNORMAL HIGH (ref <5)

## 2011-07-04 LAB — TSH: TSH: 0.489 u[IU]/mL (ref 0.350–4.500)

## 2011-07-04 MED ORDER — IBUPROFEN 800 MG PO TABS: 800.0000 mg | ORAL_TABLET | Freq: Once | ORAL | Status: DC

## 2011-07-04 MED ORDER — ACETAMINOPHEN 325 MG PO TABS
ORAL_TABLET | ORAL | Status: AC
  Administered 2011-07-04: 325 mg via ORAL
  Filled 2011-07-04: qty 2

## 2011-07-04 MED ORDER — IBUPROFEN 600 MG PO TABS: 600.0000 mg | ORAL_TABLET | Freq: Three times a day (TID) | ORAL | Status: DC | PRN

## 2011-07-04 MED ORDER — ACETAMINOPHEN 325 MG PO TABS
650.0000 mg | ORAL_TABLET | Freq: Once | ORAL | Status: AC
  Administered 2011-07-04: 325 mg via ORAL

## 2011-07-04 MED ORDER — LORAZEPAM 1 MG PO TABS: 1.0000 mg | ORAL_TABLET | Freq: Three times a day (TID) | ORAL | Status: DC | PRN

## 2011-07-04 MED ORDER — ONDANSETRON HCL 4 MG PO TABS: 4.0000 mg | ORAL_TABLET | Freq: Three times a day (TID) | ORAL | Status: DC | PRN

## 2011-07-04 NOTE — BH Assessment (Signed)
Assessment Note   Janice Bernard is an 31 y.o. female. PT PRESENT WITH INCREASE DEPRESSION & SUICIDAL THOUGHTS. PT STATES THAT AFTER HE 31 YR OLD WAS BORN THAT SHE DEVELOP POST PARTUM DEPRESSION WHICH SHE WAS PUT ON MEDS FOR. PT STATES 8 MONTHS AGO SHE STOPPED TAKING THE MEDS & NOW HER DEPRESSION HAS WORSENED WHICH SHE WANTS TO GET BACK ON MEDS. PT STATES SHE WENT TO [facility] TO GET MEDS BUT FOUND OUT SHE WAS PREGNANT. PT SAID THEY SENT HER HERE TO ED FOR CLEARANCE & FURTHER STABILIZATION. PT STATES SHE WANTS TO GET MEDS CAUSE SHE WAS AFRAID OF WHAT SHE MIGHT DO TO SELF OR OTHERS AROUND HER. PT DENIES ANY HALLUCINATION BUT ADMITS HAVING THYROID WHICH SHE HAS NOT BEEN COMPLAINT WITH MEDS FOR 2 YRS. PT ADMITS PT OCCASIONAL DRINKING. PT HAD BEEN COMPLAINING OF ABDOMINAL PAIN BUT WILL BE KEPT IN ED FOR OBSERVATION TO DECIDE IF SHE WILL BE [facility]ED [facility] FOR [facility]ION.    Axis I: Major Depression, Recurrent severe Axis II: Deferred Axis III:  Past Medical History  Diagnosis Date  . No pertinent past medical history   . Depressed    Axis IV: other psychosocial or environmental problems Axis V: 21-30 behavior considerably influenced by delusions or hallucinations OR serious impairment in judgment, communication OR inability to function in almost all areas  Past Medical History:  Past Medical History  Diagnosis Date  . No pertinent past medical history   . Depressed     Past Surgical History  Procedure Date  . Cesarean section   . Dilation and curettage of uterus   . Dilation and evacuation 02/06/2011    Procedure: DILATATION AND EVACUATION (D&E);  Surgeon: Scheryl Darter, MD;  Location: [facility];  Service: Gynecology;  Laterality: N/A;  . Dilation and evacuation 02/22/2011    Procedure: DILATATION AND EVACUATION (D&E);  Surgeon: Scheryl Darter, MD;  Location: [facility];  Service: Gynecology;  Laterality: N/A;    Family History:  Family History  Problem Relation Age of Onset  .  Depression Paternal Grandfather   . Diabetes Paternal Uncle     Social History:  reports that she has never smoked. She has never used smokeless tobacco. She reports that she drinks about 1.2 ounces of alcohol per week. She reports that she does not use illicit drugs.  Additional Social History:    Allergies:  Allergies  Allergen Reactions  . Percocet (Oxycodone-Acetaminophen)     Generalized itching abd rash    Home Medications:  Medications Prior to Admission  Medication Dose Route Frequency Provider Last Rate Last Dose  . acetaminophen (TYLENOL) tablet 650 mg  650 mg Oral Once Peter A. Patrica Duel, MD   325 mg at 07/04/11 2032  . ibuprofen (ADVIL,MOTRIN) tablet 600 mg  600 mg Oral Q8H PRN Rodena Medin, PA-C      . LORazepam (ATIVAN) tablet 1 mg  1 mg Oral Q8H PRN Rodena Medin, PA-C      . ondansetron (ZOFRAN) tablet 4 mg  4 mg Oral Q8H PRN Rodena Medin, PA-C       Medications Prior to Admission  Medication Sig Dispense Refill  . Menthol (GNP HERBAL MT) Take by mouth. Neonelubrina/  Pt uses for cough and congestion       . Prenat w/o A-FE-DSS-Methfol-FA (PRENATAL MULTIVITAMIN) 90-600-400 MG-MCG-MCG tablet Take 1 tablet by mouth daily.          OB/GYN Status:  Patient's last menstrual period  was 05/04/2011.  General Assessment Data Location of Assessment: [facility] ACT Assessment: Yes Living Arrangements: Spouse/significant other;Children;Parent;Relatives Can pt return to current living arrangement?: Yes Admission Status: Voluntary Is patient capable of signing voluntary admission?: Yes Transfer from: Acute Hospital Referral Source: Self/Family/Friend     Risk to self Suicidal Ideation: Yes-Currently Present Suicidal Intent: Yes-Currently Present Is patient at risk for suicide?: Yes Suicidal Plan?: No Access to Means: No What has been your use of drugs/alcohol within the last 12 months?: OCCASSIONAL ETOH DRINKING Previous Attempts/Gestures: No How many times?: 0    Other Self Harm Risks: NA Triggers for Past Attempts: Unpredictable Intentional Self Injurious Behavior: None Family Suicide History: No Recent stressful life event(s): Turmoil (Comment) Persecutory voices/beliefs?: No Depression: Yes Depression Symptoms: Loss of interest in usual pleasures Substance abuse history and/or treatment for substance abuse?: No Suicide prevention information given to non-admitted patients: Not applicable  Risk to Others Homicidal Ideation: No Thoughts of Harm to Others: No Current Homicidal Intent: No Current Homicidal Plan: No Access to Homicidal Means: No Identified Victim: NA History of harm to others?: No Assessment of Violence: None Noted Violent Behavior Description: CALM COOPERATIVE Does patient have access to weapons?: No Criminal Charges Pending?: No Does patient have a court date: No  Psychosis Hallucinations: None noted Delusions: None noted  Mental Status Report Appear/Hygiene: Improved Eye Contact: Good Motor Activity: Unremarkable Speech: Logical/coherent Level of Consciousness: Alert Mood: Depressed;Helpless;Sad Affect: Depressed;Sad Anxiety Level: None Thought Processes: Coherent;Relevant Judgement: Unimpaired Orientation: Person;Place;Time;Situation Obsessive Compulsive Thoughts/Behaviors: None  Cognitive Functioning Concentration: Decreased Memory: Recent Intact;Remote Intact IQ: Average Insight: Poor Impulse Control: Poor Appetite: Fair Weight Loss: 0  Weight Gain: 0  Sleep: No Change Total Hours of Sleep: 4  Vegetative Symptoms: None  Prior Inpatient Therapy Prior Inpatient Therapy: No Prior Therapy Dates: NA Prior Therapy Facilty/Provider(s): NA Reason for Treatment: NA  Prior Outpatient Therapy Prior Outpatient Therapy: Yes Prior Therapy Dates: CURRENT Prior Therapy Facilty/Provider(s): [facility] Reason for Treatment: MED MANAGEMENT            Values / Beliefs Cultural Requests During  Hospitalization: None Spiritual Requests During Hospitalization: None        Additional Information 1:1 In Past 12 Months?: No CIRT Risk: No Elopement Risk: No Does patient have medical clearance?: No     Disposition:  Disposition Disposition of Patient: Inpatient treatment program;Referred to (OBSERVATION FOR MEDICAL CLEARANCE) Type of inpatient treatment program: Adult  On Site Evaluation by:   Reviewed with Physician:     Waldron Session 07/04/2011 11:28 PM

## 2011-07-04 NOTE — ED Provider Notes (Signed)
11:20 PM  The patient was admitted to the psych ED earlier this afternoon for depression and suicidal ideation. She apparently had a miscarriage 6 months ago. However, her pregnancy test was positive. This afternoon, which was not made known to me. She feels that she is probably [redacted] weeks pregnant currently and the patient was being observed in the psych ED without any initial complaints, but is now having lower abdominal pain, and again is currently pregnant. Stat transvaginal ultrasound has been ordered to reassess. Will follow closely   Results for orders placed during the hospital encounter of 07/04/11  CBC      Component Value Range   WBC 9.6  4.0 - 10.5 (K/uL)   RBC 4.63  3.87 - 5.11 (MIL/uL)   Hemoglobin 12.5  12.0 - 15.0 (g/dL)   HCT 16.1  09.6 - 04.5 (%)   MCV 79.9  78.0 - 100.0 (fL)   MCH 27.0  26.0 - 34.0 (pg)   MCHC 33.8  30.0 - 36.0 (g/dL)   RDW 40.9  81.1 - 91.4 (%)   Platelets 248  150 - 400 (K/uL)  DIFFERENTIAL      Component Value Range   Neutrophils Relative 84 (*) 43 - 77 (%)   Neutro Abs 8.1 (*) 1.7 - 7.7 (K/uL)   Lymphocytes Relative 12  12 - 46 (%)   Lymphs Abs 1.1  0.7 - 4.0 (K/uL)   Monocytes Relative 4  3 - 12 (%)   Monocytes Absolute 0.4  0.1 - 1.0 (K/uL)   Eosinophils Relative 0  0 - 5 (%)   Eosinophils Absolute 0.0  0.0 - 0.7 (K/uL)   Basophils Relative 0  0 - 1 (%)   Basophils Absolute 0.0  0.0 - 0.1 (K/uL)  URINALYSIS, ROUTINE W REFLEX MICROSCOPIC      Component Value Range   Color, Urine YELLOW  YELLOW    APPearance CLEAR  CLEAR    Specific Gravity, Urine 1.008  1.005 - 1.030    pH 6.0  5.0 - 8.0    Glucose, UA NEGATIVE  NEGATIVE (mg/dL)   Hgb urine dipstick NEGATIVE  NEGATIVE    Bilirubin Urine NEGATIVE  NEGATIVE    Ketones, ur NEGATIVE  NEGATIVE (mg/dL)   Protein, ur NEGATIVE  NEGATIVE (mg/dL)   Urobilinogen, UA 0.2  0.0 - 1.0 (mg/dL)   Nitrite NEGATIVE  NEGATIVE    Leukocytes, UA NEGATIVE  NEGATIVE   PREGNANCY, URINE      Component Value  Range   Preg Test, Ur POSITIVE    TSH      Component Value Range   TSH 0.489  0.350 - 4.500 (uIU/mL)  ETHANOL      Component Value Range   Alcohol, Ethyl (B) <11  0 - 11 (mg/dL)  URINE RAPID DRUG SCREEN (HOSP PERFORMED)      Component Value Range   Opiates NONE DETECTED  NONE DETECTED    Cocaine NONE DETECTED  NONE DETECTED    Benzodiazepines NONE DETECTED  NONE DETECTED    Amphetamines NONE DETECTED  NONE DETECTED    Tetrahydrocannabinol NONE DETECTED  NONE DETECTED    Barbiturates NONE DETECTED  NONE DETECTED   POCT I-STAT, CHEM 8      Component Value Range   Sodium 139  135 - 145 (mEq/L)   Potassium 3.9  3.5 - 5.1 (mEq/L)   Chloride 105  96 - 112 (mEq/L)   BUN 7  6 - 23 (mg/dL)   Creatinine, Ser 7.82  0.50 - 1.10 (mg/dL)   Glucose, Bld 191 (*) 70 - 99 (mg/dL)   Calcium, Ion 4.78  2.95 - 1.32 (mmol/L)   TCO2 22  0 - 100 (mmol/L)   Hemoglobin 12.9  12.0 - 15.0 (g/dL)   HCT 62.1  30.8 - 65.7 (%)  HCG, QUANTITATIVE, PREGNANCY      Component Value Range   hCG, Beta Chain, Quant, S 6387 (*) <5 (mIU/mL)   No results found.  11:20 PM   Quant HCG ordered  US completed      US OB Transvaginal (Final result)   Result time:07/04/11 2037    Final result by Rad Results In Interface (07/04/11 20:37:13)    Narrative:   *RADIOLOGY REPORT*  Clinical Data: Positive pregnancy test with pelvic pain.  OBSTETRIC <14 WK [place] AND TRANSVAGINAL OB US  Technique: Both transabdominal and transvaginal ultrasound examinations were performed for complete evaluation of the gestation as well as the maternal uterus, adnexal regions, and pelvic cul-de-sac. Transvaginal technique was performed to assess early pregnancy.  Comparison: None.  There is a tiny cystic focus in the endometrial cavity. Image quality is degraded by patient body habitus and uterine position. As such, I cannot determine definitively whether this sac is within the decidualized endometrium more in the endometrial  cavity itself. There is no evidence for an associated yolk sac.  If this represents a true gestational sac, mean sac diameter measures 5.4 mm which would estimate a 5-week-2-day gestational age.  Maternal uterus/adnexae: 2.8 cm simple appearing cystic structure is identified within the right ovary. The maternal left ovary is normal in appearance. There is no free fluid in the cul-de-sac.  IMPRESSION: Probable early intrauterine gestational sac although this cannot be confirmed on the current study. As such, close follow-up is recommended with serial beta HCG and ultrasound as warranted to exclude the possibility of a pseudogestational sac and non- visualized ectopic gestation. There are no sonographic features at this time to suggest an ectopic gestation and no fluid is seen within the cul-de-sac.  2.8 cm simple cyst in the right ovary. This is most likely a corpus luteum cyst.  Original Report Authenticated By: ERIC A. MANSELL, M.D.    Patient is feeling better, but still has some pelvic pain. Ultrasound results reviewed showing probable early intrauterine gestational sac although this could not be confirmed. Will complete pelvic examination with female nursing supervision chaperone. Will likely need GYN consultation. Disposition will be pending  Pelvic examination completed with female nursing supervision. Exam was completely normal. No bleeding no swelling. No pain. No complications  11:20 PM  Discussed with Dr Normand Sloop, GYN.   Recommends repeat Quant in 48 hours. Motrin or Vicodin for pain. May need repeat ultrasound if her hCG is not increasing appropriately.  May need repeat hemoglobin in the morning, as well as pain persists, however, previous hemoglobin done was completely normal at 12.5.  They Are available for consultation as needed. Patient remains stable at this time and will give ibuprofen for pain and will continue to observe.  11:58 PM   will be followed by the  overnight physician.   Khaleem Burchill A. Patrica Duel, MD 07/05/11 0001

## 2011-07-04 NOTE — ED Notes (Signed)
Pt states through an interpreter that she has been feeling depressed x one month.  Denies SI/HI.  States she has not been taking any of her psych meds x 8 months.  Also states she recently lost a baby due to a miscarriage and has a 68yr old at home.

## 2011-07-04 NOTE — ED Notes (Signed)
Pt. Speaks very little english and has someone translating for her. she has two pt belongings with labels on them.

## 2011-07-04 NOTE — ED Notes (Signed)
Pt states she was going to [facility] for post- partum depression. Pt states she stop taking her medication because she had a miscarriage while taking medication. Pt states she sent here due to not taking her medication. Pt states she could be pregnant

## 2011-07-04 NOTE — ED Notes (Signed)
Pt leaves unit en route to ultrasound suite.

## 2011-07-04 NOTE — ED Provider Notes (Signed)
Medical screening examination/treatment/procedure(s) were performed by non-physician practitioner and as supervising physician I was immediately available for consultation/collaboration.  Lossie Kalp P Dustin Bumbaugh, MD 07/04/11 1536 

## 2011-07-04 NOTE — ED Notes (Signed)
Pt is to receive a pelvic exam after results of hCG were reviewed by MD. Interpreter service used to explain to patient what will happen and where she will go. During this conversation, pt describes fear to RN, stating that she had been told by ACT worker that she was going to be admitted to a psychiatric hospital. Pt states she only came here to get restarted on her meds and to find someone to talk to about her stress, and that she did not want to be admitted to a psych hospital, stating that she did not feel that bad and that being here was making her feel more depressed. Explained to pt that she was IVC'd by her MD for SI/HI toward son, pt states that those thoughts are fleeting and only when she is very stressed out. She states she would never hurt her child and that she feels more depressed not being able to see him. RN reassured pt and told her everything would get worked out and that the most important thing was for [place] to take care of her and help her stabilize so she can take care of her child and possibly her baby. Pt being transported to TCU exam room for pelvic exam by Dr. Patrica Duel at this time. Pt verbalized understanding while on phone with interpreter. Safe and stable at this time.

## 2011-07-04 NOTE — ED Notes (Addendum)
Husband comes to the nurse's station and reports that pt has sharp abdominal pain that wraps around to her back. Husband translates for pt, and says that she reports the pain started about 2 hours ago and sometimes it takes her breath. Upon entering room, pt is tearful and appears fearful. RN palpates abd and feels no abnormalities, EDP notified of pain, reports positive urine preg. EDP comes to evaluate pt and orders transvaginal ultrasound. Husband verbalizes understanding of plan and indicates that his wife does also. Pt has history of C-section 6 years ago, miscarriage 6 months ago and a belief that she is around [redacted] weeks pregnant.

## 2011-07-04 NOTE — ED Provider Notes (Signed)
History     CSN: [id number]  Arrival date & time 07/04/11  1340   First MD Initiated Contact with Patient 07/04/11 1353      Chief Complaint  Patient presents with  . V70.1    (Consider location/radiation/quality/duration/timing/severity/associated sxs/prior treatment) Patient is a 31 y.o. female presenting with mental health disorder. The history is provided by the patient.  Mental Health Problem Primary symptoms comment: She is here under IVC for SI/HI, which she states is ongoing.   The degree of incapacity that she is experiencing as a consequence of her illness is moderate. Additional symptoms of the illness include poor judgment. Associated symptoms comments: She has a PMH of 'thyroid' but states no medications were prescribed for this in the past. . She admits to suicidal ideas. She contemplates harming herself. She has not already injured self. She contemplates injuring another person.    Past Medical History  Diagnosis Date  . No pertinent past medical history   . Depressed     Past Surgical History  Procedure Date  . Cesarean section   . Dilation and curettage of uterus   . Dilation and evacuation 02/06/2011    Procedure: DILATATION AND EVACUATION (D&E);  Surgeon: Scheryl Darter, MD;  Location: [facility];  Service: Gynecology;  Laterality: N/A;  . Dilation and evacuation 02/22/2011    Procedure: DILATATION AND EVACUATION (D&E);  Surgeon: Scheryl Darter, MD;  Location: [facility];  Service: Gynecology;  Laterality: N/A;    Family History  Problem Relation Age of Onset  . Depression Paternal Grandfather   . Diabetes Paternal Uncle     History  Substance Use Topics  . Smoking status: Never Smoker   . Smokeless tobacco: Never Used  . Alcohol Use: 1.2 oz/week    2 Cans of beer per week    OB History    Grav Para Term Preterm Abortions TAB SAB Ect Mult Living   2 1 1  0 1 0 1 0 0 1      Review of Systems  Constitutional: Negative for fever and chills.  HENT: Negative.    Respiratory: Negative.   Cardiovascular: Negative.   Gastrointestinal: Negative.   Musculoskeletal: Negative.   Skin: Negative.   Neurological: Negative.   Psychiatric/Behavioral:       See HPI.    Allergies  Percocet  Home Medications   Current Outpatient Rx  Name Route Sig Dispense Refill  . GNP HERBAL MT Oral Take by mouth. Neonelubrina/  Pt uses for cough and congestion     . PRENATE ELITE 90-600-400 MG-MCG-MCG PO TABS Oral Take 1 tablet by mouth daily.        BP 112/71  Pulse 100  Temp(Src) 98.8 F (37.1 C) (Oral)  Resp 18  SpO2 100%  LMP 05/04/2011  Physical Exam  Constitutional: She appears well-developed and well-nourished.  HENT:  Head: Normocephalic.  Neck: Normal range of motion. Neck supple.  Cardiovascular: Normal rate and regular rhythm.   Pulmonary/Chest: Effort normal and breath sounds normal.  Abdominal: Soft. Bowel sounds are normal. There is no tenderness. There is no rebound and no guarding.  Musculoskeletal: Normal range of motion.  Neurological: She is alert. No cranial nerve deficit.  Skin: Skin is warm and dry. No rash noted.  Psychiatric: She has a normal mood and affect.    ED Course  Procedures (including critical care time)   Labs Reviewed  CBC  DIFFERENTIAL  I-STAT, CHEM 8  URINALYSIS, ROUTINE W REFLEX MICROSCOPIC  PREGNANCY, URINE  TSH  ETHANOL  URINE RAPID DRUG SCREEN (HOSP PERFORMED)   No results found.   No diagnosis found.    MDM          Rodena Medin, PA-C 07/04/11 1439

## 2011-07-04 NOTE — ED Notes (Signed)
MD at bedside. 

## 2011-07-05 LAB — WET PREP, GENITAL: Trich, Wet Prep: NONE SEEN

## 2011-07-05 LAB — GC/CHLAMYDIA PROBE AMP, GENITAL: GC Probe Amp, Genital: NEGATIVE

## 2011-07-05 NOTE — ED Notes (Signed)
Pt requiring constant redirection back onto stretcher for safety.  TV on for diversion.

## 2011-07-05 NOTE — BH Assessment (Signed)
Informed by Concha [place] prior to leaving and ending her shift that patients telepsych was completed. Also informed by Mindi Junker that the psychiatrist indicated that patient would be discharged home. Witnessed Marsha call Samson Frederic in assessment to inform her that patient will be discharged per telepsych request.

## 2011-07-05 NOTE — ED Notes (Signed)
Patient requesting shower. Provided toiletries and clean scrubs. Changed linen while pt showering.

## 2011-07-05 NOTE — BH Assessment (Signed)
Patients nurse-Michael sts that patient's consult has not been faxed in, therefore; pt is still waiting to be properly dispositioned. Writer contacted SOC/telepsych and requested a faxed copy of the telepsych that had already been completed.

## 2011-07-05 NOTE — BH Assessment (Signed)
Received patients telepsych and it states the following recommendations:  1. May discharge per telepsych 2. F/u with out-patient therapy

## 2011-07-24 ENCOUNTER — Inpatient Hospital Stay (HOSPITAL_COMMUNITY)
Admission: AD | Admit: 2011-07-24 | Discharge: 2011-07-24 | Disposition: A | Discharge status: Home / Self Care | Payer: Self-pay | Source: Ambulatory Visit | Attending: Obstetrics & Gynecology | Admitting: Obstetrics & Gynecology

## 2011-07-24 ENCOUNTER — Inpatient Hospital Stay (HOSPITAL_COMMUNITY): Payer: Self-pay

## 2011-07-24 ENCOUNTER — Other Ambulatory Visit: Payer: Self-pay | Admitting: Obstetrics & Gynecology

## 2011-07-24 ENCOUNTER — Encounter (HOSPITAL_COMMUNITY): Payer: Self-pay | Admitting: *Deleted

## 2011-07-24 DIAGNOSIS — O039 Complete or unspecified spontaneous abortion without complication: Secondary | ICD-10-CM | POA: Insufficient documentation

## 2011-07-24 LAB — CBC
HCT: 34.7 % — ABNORMAL LOW (ref 36.0–46.0)
Hemoglobin: 11.5 g/dL — ABNORMAL LOW (ref 12.0–15.0)
MCH: 27.2 pg (ref 26.0–34.0)
MCV: 82 fL (ref 78.0–100.0)
Platelets: 157 10*3/uL (ref 150–400)
RBC: 4.23 MIL/uL (ref 3.87–5.11)
WBC: 6.4 10*3/uL (ref 4.0–10.5)

## 2011-07-24 MED ORDER — METHYLERGONOVINE MALEATE 0.2 MG PO TABS: 0.2000 mg | ORAL_TABLET | Freq: Four times a day (QID) | ORAL | Status: DC

## 2011-07-24 NOTE — ED Provider Notes (Signed)
History   Pt presents today c/o vag bleeding in preg. All communication was performed with help of interpreter, [employer]. Pt states she has been spotting on and off for about 1wk and today she passed a large clot. She also c/o lower abd pain. She denies fever, dysuria, or any other sx at this time.  No chief complaint on file.  HPI  OB History    Grav Para Term Preterm Abortions TAB SAB Ect Mult Living   3 1 1  0 1 0 1 0 0 1      Past Medical History  Diagnosis Date  . No pertinent past medical history   . Depressed     Past Surgical History  Procedure Date  . Cesarean section   . Dilation and curettage of uterus   . Dilation and evacuation 02/06/2011    Procedure: DILATATION AND EVACUATION (D&E);  Surgeon: Scheryl Darter, MD;  Location: [facility];  Service: Gynecology;  Laterality: N/A;  . Dilation and evacuation 02/22/2011    Procedure: DILATATION AND EVACUATION (D&E);  Surgeon: Scheryl Darter, MD;  Location: [facility];  Service: Gynecology;  Laterality: N/A;    Family History  Problem Relation Age of Onset  . Diabetes Paternal Uncle     History  Substance Use Topics  . Smoking status: Former Games developer  . Smokeless tobacco: Never Used  . Alcohol Use: 1.2 oz/week    2 Cans of beer per week    Allergies:  Allergies  Allergen Reactions  . Dilaudid (Hydromorphone Hcl) Itching and Rash    Pt states she is allergic to an IV pain medication but unable to recall name of drug  . Fentanyl Itching and Rash    Pt states she is allergic to an IV pain medication but unable to recall name of drug  . Morphine And Related Itching and Rash    Pt states she is allergic to an IV pain medication but unable to recall name of drug    Prescriptions prior to admission  Medication Sig Dispense Refill  . Prenatal Vit-Fe Fumarate-FA (PRENATAL MULTIVITAMIN) TABS Take 1 tablet by mouth daily.        Review of Systems  Constitutional: Negative for fever.  Eyes: Negative for blurred vision and double  vision.  Cardiovascular: Negative for chest pain.  Gastrointestinal: Positive for abdominal pain. Negative for nausea, vomiting, diarrhea and constipation.  Genitourinary: Negative for dysuria, urgency, frequency and hematuria.  Neurological: Negative for dizziness and headaches.  Psychiatric/Behavioral: Negative for depression and suicidal ideas.   Physical Exam   Blood pressure 92/62, pulse 87, temperature 98.3 F (36.8 C), temperature source Oral, resp. rate 18, height 4\' 11"  (1.499 m), weight 109 lb (49.442 kg), last menstrual period 05/04/2011.  Physical Exam  Nursing note and vitals reviewed. Constitutional: She is oriented to person, place, and time. She appears well-developed and well-nourished. No distress.  HENT:  Head: Normocephalic and atraumatic.  Eyes: EOM are normal. Pupils are equal, round, and reactive to light.  GI: Soft. She exhibits no distension and no mass. There is tenderness. There is no rebound and no guarding.  Genitourinary: There is bleeding around the vagina. No vaginal discharge found.       Moderate amount of bleeding noted on exam. Cervix Lg/closed.  Neurological: She is alert and oriented to person, place, and time.  Skin: Skin is warm and dry. She is not diaphoretic.  Psychiatric: She has a normal mood and affect. Her behavior is normal. Judgment and thought  content normal.    08:20 phone call from Dr. Kyung Rudd reporting that she most likely has a living ectopic in the cul-de-sac measuring 6X4cm.  She reported the patient was uncomfortable and vaginal probe exam was difficult.  States it could be in the lower uterine segment, pending abortion but she favors ectopic.  I called Dr. Jolayne Panther and reported findings.  She will review films and come to [facility]U.  I called Eda Royal to let her know we will need her to interpret.  Exam done by Dr. Jolayne Panther. Explanation given to the patient by Dr. Jolayne Panther.  Plan to repeat ultrasound with Eda, interpreter in there.  Per  Dr. Jolayne Panther, the ultrasound ?s vaginal ectopic.  She went back in and examined patient and was able to get what looks like POC from the vaginal.  ?POC sent to lab for confirmation. Per Dr. Jolayne Panther she may be discharged home with Rx for Methergine q6hrs for 24 hrs and followup in the clinic in 2-3 weeks.  MAU Course  Procedures  B-quant Hcg and CBC ordered. [place] ordered.  Assessment and Plan  A:  Miscarriage, appears to be complete  P;Rx for Methergine given to patient per Dr. Deretha Emory order     Appt requested for follow up in the gyn clinic in 2-3 weeks.    Care of pt turned over to Jeani Sow, FNP.  Clinton Gallant. Rice III, DrHSc, MPAS, PA-C  07/24/2011, 7:31 AM   Henrietta Hoover, PA 07/24/11 [id number]  Matt Holmes, NP 07/24/11 1027

## 2011-07-24 NOTE — ED Provider Notes (Signed)
Agree with above note. Patient was examined me following first ultrasound with a ? Report of cul-de sac ectopic. Large clots were evacuated, cervix closed. Repeat ultrasound demonstrated ? Vaginal ectopic. Images reviewed with Dr. Sherrie George and it was felt not to be a vaginal ectopic. Repeat speculum exam performed by me showed POC floating in vaginal vault and removed. No active bleeding from os. Patient denied any further cramping pains

## 2011-07-24 NOTE — Progress Notes (Signed)
[facility] Department of Clinical Social Work Documentation of Interpretation   I assisted ___Judy  RN________________ with interpretation of ____questions__________________ for this patient. 

## 2011-07-24 NOTE — Progress Notes (Signed)
[facility] Department of Clinical Social Work Documentation of Interpretation   I assisted ___Judy RN________________ with interpretation of _discharge_____________________ for this patient.

## 2011-07-24 NOTE — Progress Notes (Signed)
TO B-ROOM- TRIED TO GIVE  URINE SAMPLE- COULD NOT VOID.

## 2011-07-24 NOTE — Progress Notes (Signed)
PT SAYS WITH INTERPRETER- DEBBIE-    DURING ENTIRE PREG HAS HAD SPOTTING.   LAST NIGHT AT 6 PM  HAD BLACK D/C.  HAS NOT BEEN HERE FOR PREG-  WENT TO [facility]- FOR VERIFORICATION- AND REC PNV. - PLANNING  TO GO THERE FOR PNC.-   NO APPOINTMENT.   CRAMPING STARTED    AT 0615-   WENT TO B-ROOM  AND HAD  LARGE AMT RED BLEEDING.  HAS ON PAD- NOW  IN TRIAGE-  NO BLOOD.    WAS EVALUATED AT [facility]  AND HAD U/S- 3-4 WEEKS AGO.  LAST SEX-  1 WEEK AGO.

## 2011-07-24 NOTE — Progress Notes (Signed)
PT SAYS WHEN SHE WAS [facility]  [facility]- SHE WANTED ANTI- DEPRESSANTS- SO SHE EXAGGERATED  SO THEY KEPT HER X1 NIGHT.  .  AT HOME  SAYS SHE PASSED A CLOT

## 2011-08-09 ENCOUNTER — Encounter (HOSPITAL_COMMUNITY): Payer: Self-pay

## 2011-08-09 ENCOUNTER — Inpatient Hospital Stay (HOSPITAL_COMMUNITY): Payer: Self-pay

## 2011-08-09 ENCOUNTER — Inpatient Hospital Stay (HOSPITAL_COMMUNITY)
Admission: AD | Admit: 2011-08-09 | Discharge: 2011-08-09 | Disposition: A | Discharge status: Home / Self Care | Payer: Self-pay | Source: Ambulatory Visit | Attending: Obstetrics & Gynecology | Admitting: Obstetrics & Gynecology

## 2011-08-09 DIAGNOSIS — R102 Pelvic and perineal pain: Secondary | ICD-10-CM

## 2011-08-09 DIAGNOSIS — N949 Unspecified condition associated with female genital organs and menstrual cycle: Secondary | ICD-10-CM | POA: Insufficient documentation

## 2011-08-09 DIAGNOSIS — R109 Unspecified abdominal pain: Secondary | ICD-10-CM | POA: Insufficient documentation

## 2011-08-09 LAB — CBC
Hemoglobin: 12.2 g/dL (ref 12.0–15.0)
MCH: 27.5 pg (ref 26.0–34.0)
MCV: 82.4 fL (ref 78.0–100.0)
Platelets: 216 10*3/uL (ref 150–400)
RBC: 4.44 MIL/uL (ref 3.87–5.11)
WBC: 6.4 10*3/uL (ref 4.0–10.5)

## 2011-08-09 LAB — URINALYSIS, ROUTINE W REFLEX MICROSCOPIC
Bilirubin Urine: NEGATIVE
Glucose, UA: NEGATIVE mg/dL
Hgb urine dipstick: NEGATIVE
Specific Gravity, Urine: 1.01 (ref 1.005–1.030)
Urobilinogen, UA: 0.2 mg/dL (ref 0.0–1.0)
pH: 5.5 (ref 5.0–8.0)

## 2011-08-09 NOTE — Progress Notes (Signed)
[facility] Department of Clinical Social Work Documentation of Interpretation   I assisted __Lori RN__& Teller PA_______________ with interpretation of questions______________________ for this patient.

## 2011-08-09 NOTE — Progress Notes (Signed)
Pt C/O scant black discharge when wiping.  Pt also C/O difficulty urinating.

## 2011-08-09 NOTE — Progress Notes (Signed)
[facility] Department of Clinical Social Work Documentation of Interpretation   I assisted __Donna RN_________________ with interpretation of _____triage_________________ for this patient. 

## 2011-08-09 NOTE — Progress Notes (Signed)
Patient states she has been having abdominal pain for about one week, continues to have a black discharge. Has been having a little pain with urinating and with bowel movements.

## 2011-08-09 NOTE — ED Provider Notes (Signed)
History   Pt presents today c/o lower abd pain for the past 1wk. She had a probable complete AB on 07/24/11. She states she feels like she has retained products because she feel exactly like she did when this happened previously. She denies heavy bleeding, fever. She does report some pain with urination. All communication done via interpreter.  Chief Complaint  Patient presents with  . Abdominal Pain   HPI  OB History    Grav Para Term Preterm Abortions TAB SAB Ect Mult Living   3 1 1  0 1 0 1 0 0 1      Past Medical History  Diagnosis Date  . No pertinent past medical history   . Depressed     Past Surgical History  Procedure Date  . Cesarean section   . Dilation and curettage of uterus   . Dilation and evacuation 02/06/2011    Procedure: DILATATION AND EVACUATION (D&E);  Surgeon: Scheryl Darter, MD;  Location: [facility];  Service: Gynecology;  Laterality: N/A;  . Dilation and evacuation 02/22/2011    Procedure: DILATATION AND EVACUATION (D&E);  Surgeon: Scheryl Darter, MD;  Location: [facility];  Service: Gynecology;  Laterality: N/A;    Family History  Problem Relation Age of Onset  . Diabetes Paternal Uncle   . Anesthesia problems Neg Hx     History  Substance Use Topics  . Smoking status: Never Smoker   . Smokeless tobacco: Never Used  . Alcohol Use: 1.8 oz/week    3 Cans of beer per week    Allergies:  Allergies  Allergen Reactions  . Dilaudid (Hydromorphone Hcl) Itching and Rash    Pt states she is allergic to an IV pain medication but unable to recall name of drug  . Fentanyl Itching and Rash    Pt states she is allergic to an IV pain medication but unable to recall name of drug  . Morphine And Related Itching and Rash    Pt states she is allergic to an IV pain medication but unable to recall name of drug    Prescriptions prior to admission  Medication Sig Dispense Refill  . penicillin v potassium (VEETID) 500 MG tablet Take 500 mg by mouth 4 (four) times daily.  Patient got from [place] market, took one yesterday for sore throat        Review of Systems  Constitutional: Negative for fever.  Eyes: Negative for blurred vision and double vision.  Cardiovascular: Negative for chest pain and palpitations.  Gastrointestinal: Positive for abdominal pain. Negative for nausea, vomiting, diarrhea and constipation.  Genitourinary: Positive for dysuria. Negative for urgency, frequency and hematuria.  Neurological: Negative for dizziness and headaches.  Psychiatric/Behavioral: Negative for depression and suicidal ideas.   Physical Exam   Blood pressure 114/63, pulse 81, temperature 98.1 F (36.7 C), temperature source Oral, resp. rate 16, height 4\' 11"  (1.499 m), weight 107 lb 3.2 oz (48.626 kg), last menstrual period 05/04/2011, SpO2 100.00%, unknown if currently breastfeeding.  Physical Exam  Nursing note and vitals reviewed. Constitutional: She is oriented to person, place, and time. She appears well-developed and well-nourished. No distress.  HENT:  Head: Normocephalic and atraumatic.  Eyes: EOM are normal. Pupils are equal, round, and reactive to light.  GI: Soft. She exhibits no distension and no mass. There is tenderness. There is no rebound and no guarding.  Genitourinary: There is bleeding around the vagina. Vaginal discharge found.       Dark vag blood noted in vag  vault. Cervix Lg/closed. Pt tender to adnexa bilaterally. No obvious masses noted.  Neurological: She is alert and oriented to person, place, and time.  Skin: Skin is warm and dry. She is not diaphoretic.  Psychiatric: She has a normal mood and affect. Her behavior is normal. Judgment and thought content normal.    MAU Course  Procedures  Results for orders placed during the hospital encounter of 08/09/11 (from the past 24 hour(s))  URINALYSIS, ROUTINE W REFLEX MICROSCOPIC     Status: Normal   Collection Time   08/09/11  9:05 AM      Component Value Range   Color, Urine YELLOW   YELLOW    APPearance CLEAR  CLEAR    Specific Gravity, Urine 1.010  1.005 - 1.030    pH 5.5  5.0 - 8.0    Glucose, UA NEGATIVE  NEGATIVE (mg/dL)   Hgb urine dipstick NEGATIVE  NEGATIVE    Bilirubin Urine NEGATIVE  NEGATIVE    Ketones, ur NEGATIVE  NEGATIVE (mg/dL)   Protein, ur NEGATIVE  NEGATIVE (mg/dL)   Urobilinogen, UA 0.2  0.0 - 1.0 (mg/dL)   Nitrite NEGATIVE  NEGATIVE    Leukocytes, UA NEGATIVE  NEGATIVE   CBC     Status: Normal   Collection Time   08/09/11  9:45 AM      Component Value Range   WBC 6.4  4.0 - 10.5 (K/uL)   RBC 4.44  3.87 - 5.11 (MIL/uL)   Hemoglobin 12.2  12.0 - 15.0 (g/dL)   HCT 16.1  09.6 - 04.5 (%)   MCV 82.4  78.0 - 100.0 (fL)   MCH 27.5  26.0 - 34.0 (pg)   MCHC 33.3  30.0 - 36.0 (g/dL)   RDW 40.9  81.1 - 91.4 (%)   Platelets 216  150 - 400 (K/uL)    US Transvaginal Non-ob  08/09/2011  *RADIOLOGY REPORT*  Clinical Data: Pain and discharge; recent miscarriage  TRANSVAGINAL ULTRASOUND OF PELVIS  Technique:  Transvaginal ultrasound examination of the pelvis was performed including evaluation of the uterus, ovaries, adnexal regions, and pelvic cul-de-sac.  Comparison:  July 24, 2011  Findings: The uterus has a normal size and echotexture, measuring 7.6 x 4.2 x 5.5 cm.  The endometrial stripe is thin and homogeneous, measuring 9 mm in width.  The previously identified spontaneous abortion in progress located in the left vaginal fornix is no longer identified.  Both ovaries have a normal size and appearance.  The right ovary measures 3.2 x 2.2 x 2.7 cm, and the left ovary measures 2.9 x 2.7 x 2.0 cm.  There are no adnexal masses or free pelvic fluid.  IMPRESSION: Normal pelvic ultrasound.  Original Report Authenticated By: Brandon Melnick, M.D.    Assessment and Plan  Pelvic pain: no evidence of retained products or other etiology. She will keep her scheduled appt for f/u.  Clinton Gallant. Ree Alcalde III, DrHSc, MPAS, PA-C  08/09/2011, 9:42 AM   Henrietta Hoover,  PA 08/09/11 1152

## 2011-08-16 ENCOUNTER — Ambulatory Visit (INDEPENDENT_AMBULATORY_CARE_PROVIDER_SITE_OTHER): Payer: Self-pay | Admitting: Physician Assistant

## 2011-08-16 ENCOUNTER — Encounter: Payer: Self-pay | Admitting: Physician Assistant

## 2011-08-16 VITALS — BP 112/70 | HR 86 | Temp 98.7°F | Ht 60.0 in | Wt 106.0 lb

## 2011-08-16 DIAGNOSIS — O039 Complete or unspecified spontaneous abortion without complication: Secondary | ICD-10-CM

## 2011-08-16 DIAGNOSIS — F329 Major depressive disorder, single episode, unspecified: Secondary | ICD-10-CM

## 2011-08-16 MED ORDER — SERTRALINE HCL 100 MG PO TABS: 150.0000 mg | ORAL_TABLET | Freq: Every day | ORAL | Status: DC

## 2011-08-16 NOTE — Patient Instructions (Signed)
Ansiedad y crisis de [place] (Anxiety and Panic Attacks) El profesional que lo asiste le ha informado que usted padece ansiedad o crisis de [place]. Este trastorno puede presentarse de [employer]. La mayor parte de las veces las crisis aparecen de modo repentino y sin aviso. Se producen en cualquier momento del da, incluso durante el sueo, y en cualquier etapa de la vida. Pueden ser muy intensas e inexplicadas. Aunque un ataque de pnico puede ser muy atemorizante, no produce daos fsicos. Algunas veces se desconoce la causa de la ansiedad. La ansiedad es un mecanismo protector del organismo en su respuesta de lucha o escape. Muchas de estas situaciones de percepcin de peligro son en realidad situaciones no fsicas (como la ansiedad de perder el [place]). CAUSAS Las causas de la ansiedad o de un ataque de pnico pueden ser [place]. Los ataques de pnico pueden ocurrir en personas sanas en una serie de circunstancias. Es posible que haya una causa gentica de los ataques de pnico. Algunos medicamentos pueden provocar ansiedad como efecto secundario. SNTOMAS Algunas de las sensaciones ms comunes son:  Terror intenso.   Vahdos, desfallecimiento.   Golpes de fro y Airline pilot.   Temor a Estate manager/land agent.   Sentimiento de irrealidad.   Sudoracin.   Temblores.   Dolor en el pecho y latidos irregulares (palpitaciones).   Sensaciones de [employer] o sofocos.   Sentimiento de peligro inminente y de que la muerte est prxima.   Hormigueo en las extremidades que puede venir de la respiracin agitada.   Alteracin de la realidad (desrealizacin).   Sentirse separado de uno mismo (despersonalizacin).  Estos son los sntomas (problemas) ms comunes y pueden combinarse para presentar la crisis de [place].  DIAGNSTICO La evaluacin que realice el profesional depender del tipo de sntomas que est experimentando. El diagnstico de la ansiedad o de ataque de pnico se realiza cuando no se  encuentra ninguna enfermedad fsica que pueda determinarse como la causa de los sntomas. TRATAMIENTO El tratamiento para prevenir la ansiedad y los ataques de pnico incluye:  Automotive engineer las circunstancias que causen ansiedad.   Reaseguro y relajacin.   Ejercicio regular.   Terapias de relajacin, como yoga.   Psicoterapia con un psiquiatra o terapeuta.   Evitar la cafena, el alcohol y las drogas [address]   Medicamentos de prescripcin.  SOLICITE ATENCIN MDICA DE INMEDIATO SI:  Usted experimenta sntomas de ataque de pnico que son distintos a sus sntomas usuales.   Tiene cualquier sntoma que empeora o lo preocupa.  Document Released: 06/11/2005 Document Revised: 02/21/2011 [facility] Patient Information 2012 [place], [place].Depresin (Depression) Usted presenta signos de depresin. Es un trastorno que puede ocurrir a Actuary. Con frecuencia es difcil de Public house manager. Neomia Dear persona puede estar deprimida y adems tener momentos de Archivist. La depresin interfiere en la capacidad bsica para funcionar en la vida diaria. Obstaculiza tanto las [employer] hbitos de sueo, alimentacin y [place]. CAUSAS Se cree que la causa es un desequilibrio en las sustancias qumicas del cerebro. El origen puede ser un hecho displacentero. La crisis en una relacin, la muerte de un familiar, preocupaciones econmicas, la jubilacin y otros factores estresantes son causas normales de depresin. Tambin puede comenzar sin causa aparente. Otros factores que pueden tener incidencia: algunas enfermedades, algunos medicamentos, factores genticos, y consumo excesivo de alcohol o drogas. SNTOMAS  Sentimiento de desdicha o desvalorizacin.   Cansancio crnico o sensacin de agotamiento.   Pensamientos y acciones de autodestruccin.   Dificultad para dormir o dormir demasiado.  Comer ms de lo habitual o no alimentarse en absoluto.   Cefaleas o ansiedad.   Dificultad para  concentrase o tomar decisiones.   Sntomas fsicos sin causa y consumo de drogas.  TRATAMIENTO Generalmente mejora si se realiza Pharmacist, community. Entre ellos se incluyen:  Medicamentos antidepresivos. Puede demorara algunas semanas antes de llegar a la dosis [place] y a los beneficios.   Converse con un terapeuta, ministro, consejero o amigo. Estas personas pueden ayudarlo a comprender su problema y a controlar nuevamente sus actos.   Consuma una dieta saludable.   Ralice actividad fisica de [place] regular, como caminar durante 30 minutos [address].   No consuma alcohol ni drogas.  El tratamiento de la depresin puede llevar 6 meses o ms. El tratamiento debe mantenerse para evitar que los sntomas vuelvan a Research officer, trade union. Asegrese de comunicarse con el profesional que lo asiste y Surveyor, mining una entrevista de control, como se lo ha sugerido el equipo que lo ha [place]. SOLICITE ATENCIN MDICA DE INMEDIATO SI:  Comienza a tener pensamientos acerca de lastimarse o daar a Economist.   Comunquese con el servicio de emergencias de su localidad (911 en los [place]).   Concurra al servicio de emergencias mdicas de su localidad.   Comunquese con la Lnea Telefnica Nacional para la Prevencin del Suicidio (National Suicide Prevention Lifeline ) al 1-800-273-TALK 6014456872).  Document Released: 06/11/2005 Document Revised: 02/21/2011 [facility] Patient Information 2012 [place], [place].

## 2011-08-16 NOTE — Progress Notes (Signed)
Patient given phone number for [facility] Mental health to set up counseling per request of provider

## 2011-08-16 NOTE — Progress Notes (Signed)
Chief Complaint:  Follow-up   Janice Bernard is  31 y.o. G3P1011.  Patient's last menstrual period was 05/04/2011.Marland Kitchen  Her pregnancy status is negative.  She presents complaining of Follow-up  Presents for FU after complete SAB. Reports brownish vaginal d/c and menstrual-like cramping x 2-3 days. Denies fever, chills, abd pain, dysuria. Reports increased depression s/s. Denies suicidal ideations. Was previously inpt at [facility] was d/c'd on sertraline and plan for outpt FU for therapy. Pt reports outpt appts were not coordinated at discharge. Has bottle of sertraline with her today with 1 additional refill from the [facility]. States she felt great on the medication but started tapering off when symptoms resolved then stopped meds with pregnancy. Desires to restart today.    Obstetrical/Gynecological History: OB History    Grav Para Term Preterm Abortions TAB SAB Ect Mult Living   3 1 1  0 1 0 1 0 0 1      Past Medical History: Past Medical History  Diagnosis Date  . No pertinent past medical history   . Depressed     Past Surgical History: Past Surgical History  Procedure Date  . Cesarean section   . Dilation and curettage of uterus   . Dilation and evacuation 02/06/2011    Procedure: DILATATION AND EVACUATION (D&E);  Surgeon: Scheryl Darter, MD;  Location: [facility];  Service: Gynecology;  Laterality: N/A;  . Dilation and evacuation 02/22/2011    Procedure: DILATATION AND EVACUATION (D&E);  Surgeon: Scheryl Darter, MD;  Location: [facility];  Service: Gynecology;  Laterality: N/A;    Family History: Family History  Problem Relation Age of Onset  . Diabetes Paternal Uncle   . Anesthesia problems Neg Hx     Social History: History  Substance Use Topics  . Smoking status: Never Smoker   . Smokeless tobacco: Never Used  . Alcohol Use: 1.8 oz/week    3 Cans of beer per week    Allergies:  Allergies  Allergen Reactions  . Dilaudid (Hydromorphone Hcl) Itching and Rash    Pt states  she is allergic to an IV pain medication but unable to recall name of drug  . Fentanyl Itching and Rash    Pt states she is allergic to an IV pain medication but unable to recall name of drug  . Morphine And Related Itching and Rash    Pt states she is allergic to an IV pain medication but unable to recall name of drug     (Not in a hospital admission)  Review of Systems - Negative except what has been reviewed in the HPI  Physical Exam   Blood pressure 112/70, pulse 86, temperature 98.7 F (37.1 C), temperature source Oral, height 5' (1.524 m), weight 106 lb (48.081 kg), last menstrual period 05/04/2011.  General: General appearance - alert, well appearing, and in no distress, oriented to person, place, and time and overweight Mental status - alert, oriented to person, place, and time, normal mood, behavior, speech, dress, motor activity, and thought processes, tearful at times during exam Abdomen - soft, nontender, nondistended, no masses or organomegaly Back exam - full range of motion, no tenderness, palpable spasm or pain on motion Focused Gynecological Exam: examination not indicated  Imaging Studies:  US Ob Comp Less 14 Wks  07/24/2011  OBSTETRICAL ULTRASOUND: This exam was performed within a [place] [facility]. The OB US report was generated in the AS system, and faxed to the ordering physician.   This report is also available in  [facility]'s [facility] and in the [employer]. See [facility] Obstetric US report.   US Transvaginal Non-ob  08/09/2011  *RADIOLOGY REPORT*  Clinical Data: Pain and discharge; recent miscarriage  TRANSVAGINAL ULTRASOUND OF PELVIS  Technique:  Transvaginal ultrasound examination of the pelvis was performed including evaluation of the uterus, ovaries, adnexal regions, and pelvic cul-de-sac.  Comparison:  July 24, 2011  Findings: The uterus has a normal size and echotexture, measuring 7.6 x 4.2 x 5.5 cm.  The endometrial stripe is thin  and homogeneous, measuring 9 mm in width.  The previously identified spontaneous abortion in progress located in the left vaginal fornix is no longer identified.  Both ovaries have a normal size and appearance.  The right ovary measures 3.2 x 2.2 x 2.7 cm, and the left ovary measures 2.9 x 2.7 x 2.0 cm.  There are no adnexal masses or free pelvic fluid.  IMPRESSION: Normal pelvic ultrasound.  Original Report Authenticated By: Brandon Melnick, M.D.     Assessment: 1. Depression  sertraline (ZOLOFT) 100 MG tablet  2. Abortion, complete      Plan: Referral to Behavioral Health for outpt therapy Will restart Sertraline today Abstinence for birth control at present, reviewed condom/spermicide use if becomes sexually active.  RTC prn or for restart of BC   Dora Clauss E. 08/16/2011,1:32 PM

## 2012-05-04 ENCOUNTER — Inpatient Hospital Stay (HOSPITAL_COMMUNITY)
Admission: AD | Admit: 2012-05-04 | Discharge: 2012-05-05 | Disposition: A | Discharge status: Hospice | Payer: Self-pay | Source: Ambulatory Visit | Attending: Obstetrics & Gynecology | Admitting: Obstetrics & Gynecology

## 2012-05-04 ENCOUNTER — Encounter (HOSPITAL_COMMUNITY): Payer: Self-pay | Admitting: *Deleted

## 2012-05-04 DIAGNOSIS — B3731 Acute candidiasis of vulva and vagina: Secondary | ICD-10-CM | POA: Insufficient documentation

## 2012-05-04 DIAGNOSIS — Z3202 Encounter for pregnancy test, result negative: Secondary | ICD-10-CM

## 2012-05-04 DIAGNOSIS — N949 Unspecified condition associated with female genital organs and menstrual cycle: Secondary | ICD-10-CM | POA: Insufficient documentation

## 2012-05-04 DIAGNOSIS — B373 Candidiasis of vulva and vagina: Secondary | ICD-10-CM

## 2012-05-04 DIAGNOSIS — N390 Urinary tract infection, site not specified: Secondary | ICD-10-CM | POA: Insufficient documentation

## 2012-05-04 DIAGNOSIS — L293 Anogenital pruritus, unspecified: Secondary | ICD-10-CM | POA: Insufficient documentation

## 2012-05-04 LAB — WET PREP, GENITAL
Trich, Wet Prep: NONE SEEN
Yeast Wet Prep HPF POC: NONE SEEN

## 2012-05-04 LAB — URINALYSIS, ROUTINE W REFLEX MICROSCOPIC
Bilirubin Urine: NEGATIVE
Glucose, UA: NEGATIVE mg/dL
Ketones, ur: NEGATIVE mg/dL
Nitrite: POSITIVE — AB
Specific Gravity, Urine: 1.01 (ref 1.005–1.030)
pH: 5.5 (ref 5.0–8.0)

## 2012-05-04 LAB — URINE MICROSCOPIC-ADD ON

## 2012-05-04 MED ORDER — PHENAZOPYRIDINE HCL 100 MG PO TABS
200.0000 mg | ORAL_TABLET | Freq: Once | ORAL | Status: AC
  Administered 2012-05-05: 200 mg via ORAL
  Filled 2012-05-04: qty 2

## 2012-05-04 MED ORDER — CIPROFLOXACIN HCL 500 MG PO TABS
500.0000 mg | ORAL_TABLET | Freq: Once | ORAL | Status: AC
  Administered 2012-05-05: 500 mg via ORAL
  Filled 2012-05-04: qty 1

## 2012-05-04 MED ORDER — FLUCONAZOLE 150 MG PO TABS
150.0000 mg | ORAL_TABLET | Freq: Once | ORAL | Status: AC
  Administered 2012-05-05: 150 mg via ORAL
  Filled 2012-05-04: qty 1

## 2012-05-04 NOTE — MAU Provider Note (Signed)
Chief Complaint: Vaginal Itching  First Provider Initiated Contact with Patient 05/04/12 2346     SUBJECTIVE HPI: Janice Bernard is a 31 y.o. [id number] at [facility] 04/20/12 who presents with possible pregnancy, vaginal itching, pain with urination, urgency and bilat low abd pain, worse after urination.Marland Kitchen LMP 04/20/2012. Denies vaginal discharge, VB, flank pain, fever, chills, dyspareunia. Has not tried anything for the pain.    Past Medical History  Diagnosis Date  . No pertinent past medical history   . Depressed    OB History    Grav Para Term Preterm Abortions TAB SAB Ect Mult Living   4 1 1  0 2 0 2 0 0 1     # Outc Date GA Lbr Len/2nd Wgt Sex Del Anes PTL Lv   1 TRM 2006     LVCS      2 SAB            Comments: D&E for pregnancy loss at approximately [redacted] wks EGA   3 GRA            Comments: System Generated. Please review and update pregnancy details.   4 SAB              Past Surgical History  Procedure Date  . Cesarean section   . Dilation and curettage of uterus   . Dilation and evacuation 02/06/2011    Procedure: DILATATION AND EVACUATION (D&E);  Surgeon: Scheryl Darter, MD;  Location: [facility];  Service: Gynecology;  Laterality: N/A;  . Dilation and evacuation 02/22/2011    Procedure: DILATATION AND EVACUATION (D&E);  Surgeon: Scheryl Darter, MD;  Location: [facility];  Service: Gynecology;  Laterality: N/A;   History   Social History  . Marital Status: Married    Spouse Name: N/A    Number of Children: N/A  . Years of Education: N/A   Occupational History  . Not on file.   Social History Main Topics  . Smoking status: Never Smoker   . Smokeless tobacco: Never Used  . Alcohol Use: 1.8 oz/week    3 Cans of beer per week  . Drug Use: No  . Sexually Active: Yes    Birth Control/ Protection: None   Other Topics Concern  . Not on file   Social History Narrative  . No narrative on file   No current facility-administered medications on file prior to encounter.   Current  Outpatient Prescriptions on File Prior to Encounter  Medication Sig Dispense Refill  . sertraline (ZOLOFT) 100 MG tablet Take 1.5 tablets (150 mg total) by mouth daily.  30 tablet  1   Allergies  Allergen Reactions  . Dilaudid (Hydromorphone Hcl) Itching and Rash    Pt states she is allergic to an IV pain medication but unable to recall name of drug  . Fentanyl Itching and Rash    Pt states she is allergic to an IV pain medication but unable to recall name of drug  . Morphine And Related Itching and Rash    Pt states she is allergic to an IV pain medication but unable to recall name of drug    ROS: Pertinent items in HPI  OBJECTIVE Blood pressure 118/76, pulse 85, temperature 98.9 F (37.2 C), temperature source Oral, resp. rate 16, height 5' (1.524 m), weight 52.617 kg (116 lb), last menstrual period 04/20/2012, SpO2 100.00%. GENERAL: Well-developed, well-nourished female in no acute distress.  HEENT: Normocephalic HEART: normal rate RESP: normal effort ABDOMEN: Soft, mild low abd tenderness,  no mass or rebound.  EXTREMITIES: Nontender, no edema NEURO: Alert and oriented SPECULUM EXAM: NEFG, moderate amount of curd-like, odorless discharge, no blood noted, cervix clean BIMANUAL: cervix closed; uterus normal size, no adnexal tenderness or masses, no CMT. Bladder tender.  LAB RESULTS Results for orders placed during the hospital encounter of 05/04/12 (from the past 24 hour(s))  URINALYSIS, ROUTINE W REFLEX MICROSCOPIC     Status: Abnormal   Collection Time   05/04/12 10:04 PM      Component Value Range   Color, Urine YELLOW  YELLOW   APPearance CLEAR  CLEAR   Specific Gravity, Urine 1.010  1.005 - 1.030   pH 5.5  5.0 - 8.0   Glucose, UA NEGATIVE  NEGATIVE mg/dL   Hgb urine dipstick SMALL (*) NEGATIVE   Bilirubin Urine NEGATIVE  NEGATIVE   Ketones, ur NEGATIVE  NEGATIVE mg/dL   Protein, ur NEGATIVE  NEGATIVE mg/dL   Urobilinogen, UA 0.2  0.0 - 1.0 mg/dL   Nitrite POSITIVE  (*) NEGATIVE   Leukocytes, UA SMALL (*) NEGATIVE  URINE MICROSCOPIC-ADD ON     Status: Normal   Collection Time   05/04/12 10:04 PM      Component Value Range   Squamous Epithelial / LPF RARE  RARE   WBC, UA 3-6  <3 WBC/hpf   RBC / HPF 0-2  <3 RBC/hpf  POCT PREGNANCY, URINE     Status: Normal   Collection Time   05/04/12 10:14 PM      Component Value Range   Preg Test, Ur NEGATIVE  NEGATIVE  WET PREP, GENITAL     Status: Abnormal   Collection Time   05/04/12 11:35 PM      Component Value Range   Yeast Wet Prep HPF POC NONE SEEN  NONE SEEN   Trich, Wet Prep NONE SEEN  NONE SEEN   Clue Cells Wet Prep HPF POC FEW (*) NONE SEEN   WBC, Wet Prep HPF POC FEW (*) NONE SEEN    IMAGING No results found.  MAU COURSE Meds ordered this encounter  Medications  . ciprofloxacin (CIPRO) tablet 500 mg    Sig:   . phenazopyridine (PYRIDIUM) tablet 200 mg    Sig:   . fluconazole (DIFLUCAN) tablet 150 mg    ASSESSMENT 1. Candidiasis of vulva and vagina   2. UTI (lower urinary tract infection)   3. Negative pregnancy test     PLAN Discharge home     Follow-up Information    Follow up with gynecologist. (As needed if symptoms worsen)           Medication List     As of 05/05/2012  8:39 AM    TAKE these medications         ciprofloxacin 500 MG tablet   Commonly known as: CIPRO   Take 1 tablet (500 mg total) by mouth 2 (two) times daily.      miconazole 2 % vaginal cream   Commonly known as: MONISTAT 7   Place 7 Applicatorfuls vaginally at bedtime.      phenazopyridine 200 MG tablet   Commonly known as: PYRIDIUM   Take 1 tablet (200 mg total) by mouth 3 (three) times daily as needed for pain.      sertraline 100 MG tablet   Commonly known as: ZOLOFT   Take 1.5 tablets (150 mg total) by mouth daily.           Jennette, CNM 05/05/2012  8:39 AM

## 2012-05-04 NOTE — MAU Note (Signed)
Pt reports vaginal itching and burning for 3 days. Pt also reports that she has pain when she urinates.

## 2012-05-04 NOTE — MAU Note (Signed)
Pt reports she thinks she may be pregnant, vaginal itching, vaginal burning with urination. LMP 05/21/2012

## 2012-05-05 MED ORDER — CIPROFLOXACIN HCL 500 MG PO TABS: 500.0000 mg | ORAL_TABLET | Freq: Two times a day (BID) | ORAL | Status: DC

## 2012-05-05 MED ORDER — PHENAZOPYRIDINE HCL 200 MG PO TABS: 200.0000 mg | ORAL_TABLET | Freq: Three times a day (TID) | ORAL | Status: DC | PRN

## 2012-05-05 MED ORDER — MICONAZOLE NITRATE 2 % VA CREA: 1.0000 | TOPICAL_CREAM | Freq: Every day | VAGINAL | Status: DC

## 2012-05-06 LAB — GC/CHLAMYDIA PROBE AMP, GENITAL: GC Probe Amp, Genital: NEGATIVE

## 2012-05-12 NOTE — MAU Provider Note (Signed)
Attestation of Attending Supervision of Advanced Practitioner (CNM/NP): Evaluation and management procedures were performed by the Advanced Practitioner under my supervision and collaboration.  I have reviewed the Advanced Practitioner's note and chart, and I agree with the management and plan.  HARRAWAY-SMITH, Monti Villers 3:08 PM     

## 2012-07-26 IMAGING — US US TRANSVAGINAL NON-OB
1 series · 14 of 25 positions shown · non-contrast
Comparison: July 24, 2011

CLINICAL DATA: Pain and discharge; recent miscarriage

TRANSVAGINAL ULTRASOUND OF PELVIS
TECHNIQUE: Transvaginal ultrasound examination of the pelvis was
performed including evaluation of the uterus, ovaries, adnexal
regions, and pelvic cul-de-sac.

[Series 1: us transvaginal non-ob · 57 acquisitions, 14 frames shown]
[im 1/57]
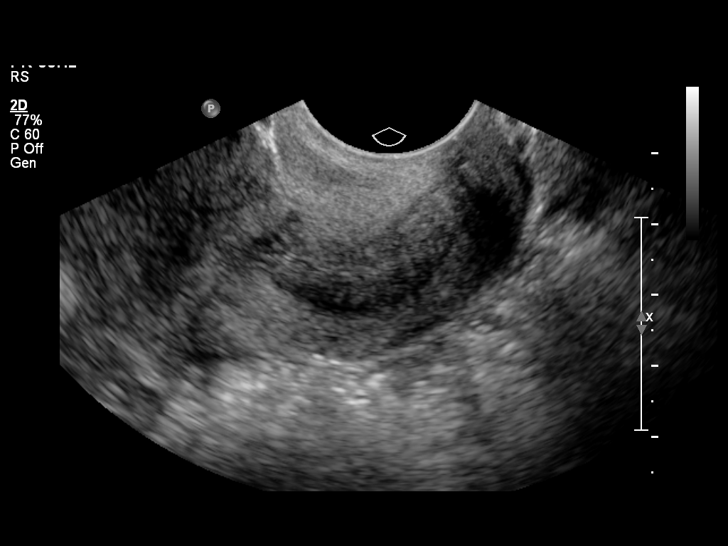
[im 5/57]
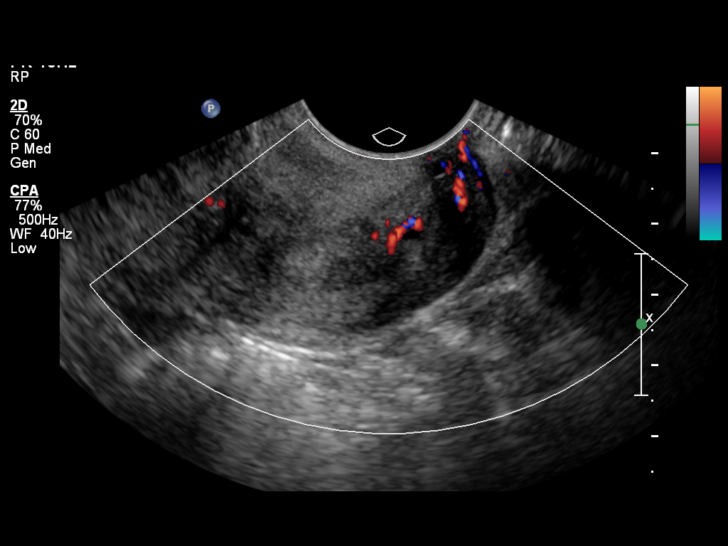
[im 10/57]
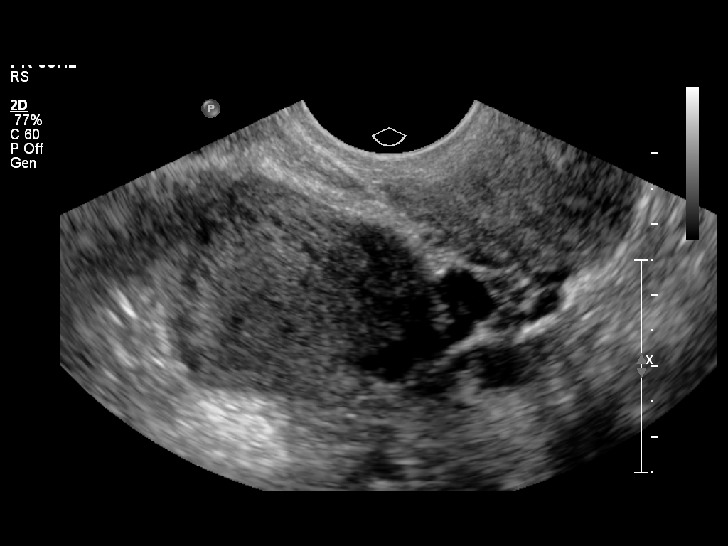
[im 15/57]
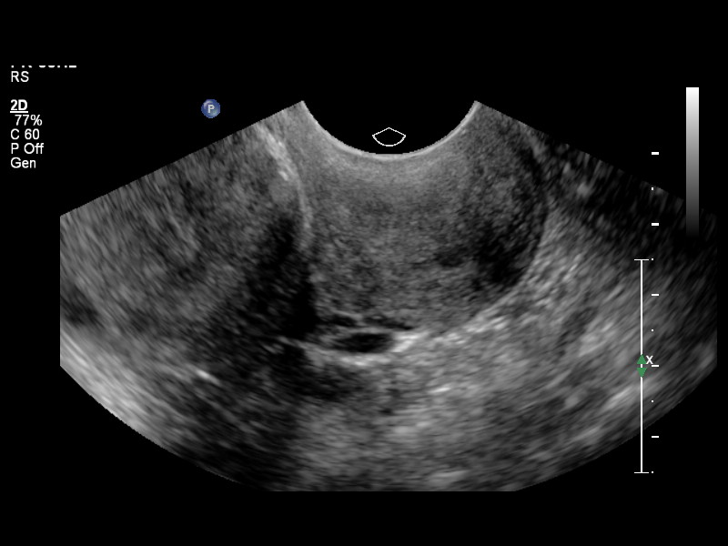
[im 19/57]
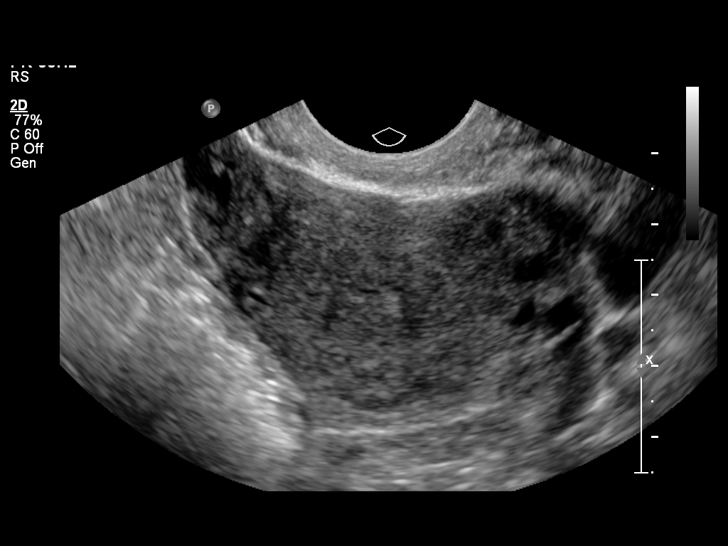
[im 22/57]
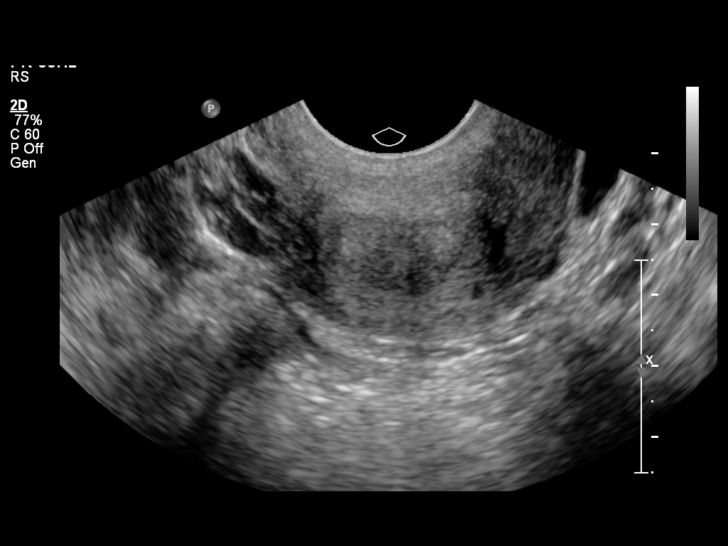
[im 26/57]
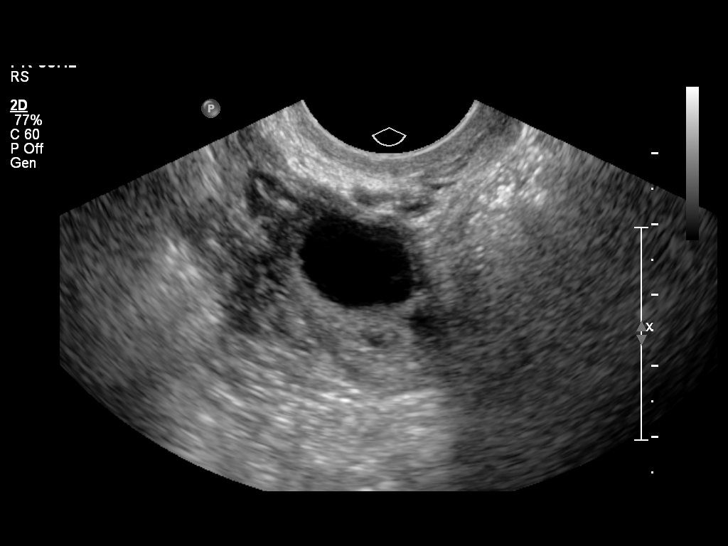
[im 31/57]
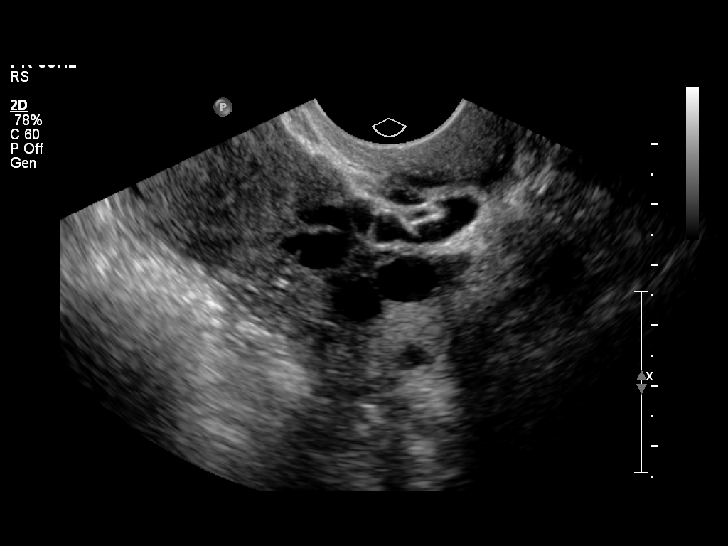
[im 36/57]
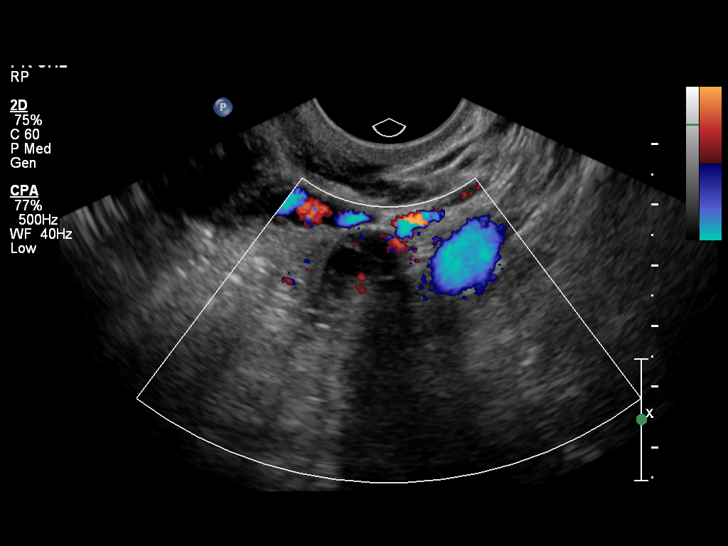
[im 38/57]
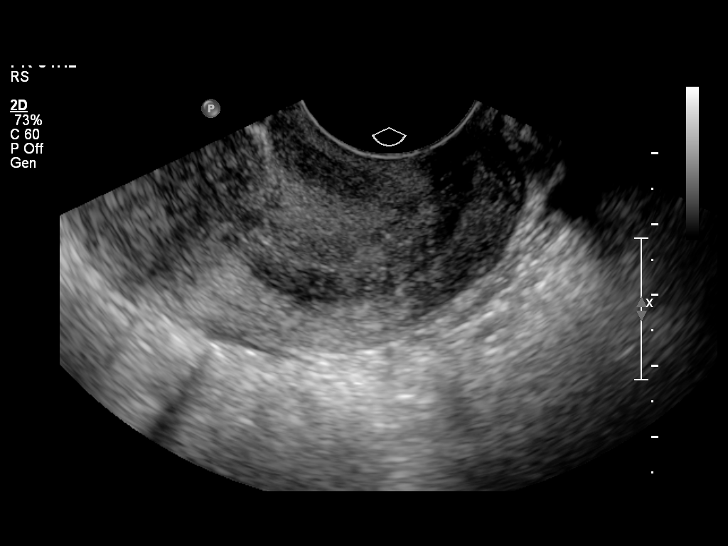
[im 43/57]
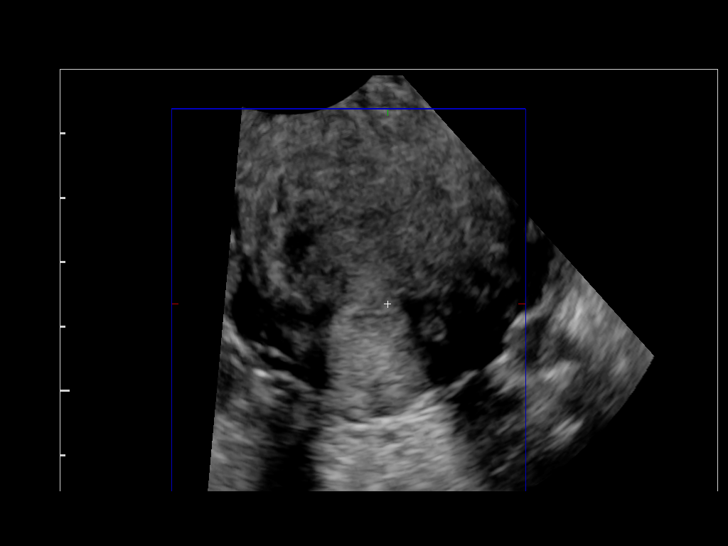
[im 47/57]
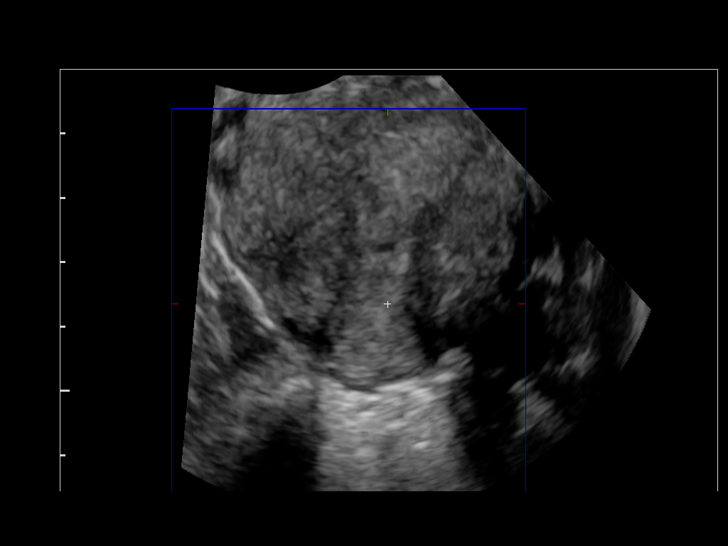
[im 52/57]
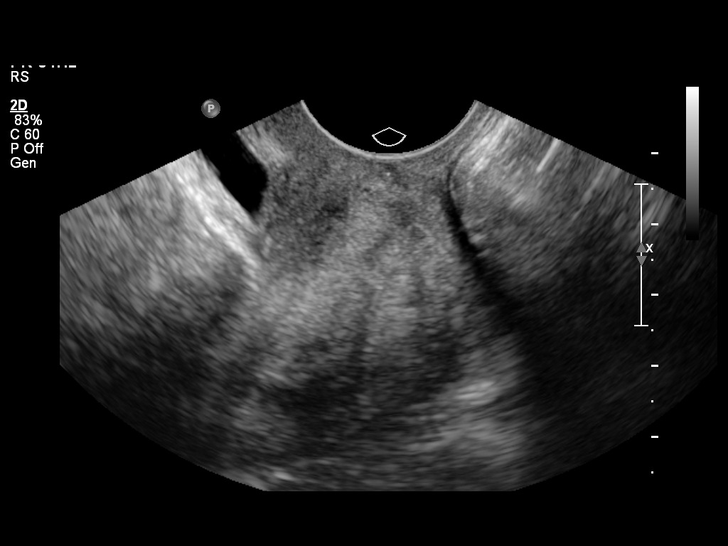
[im 57/57]
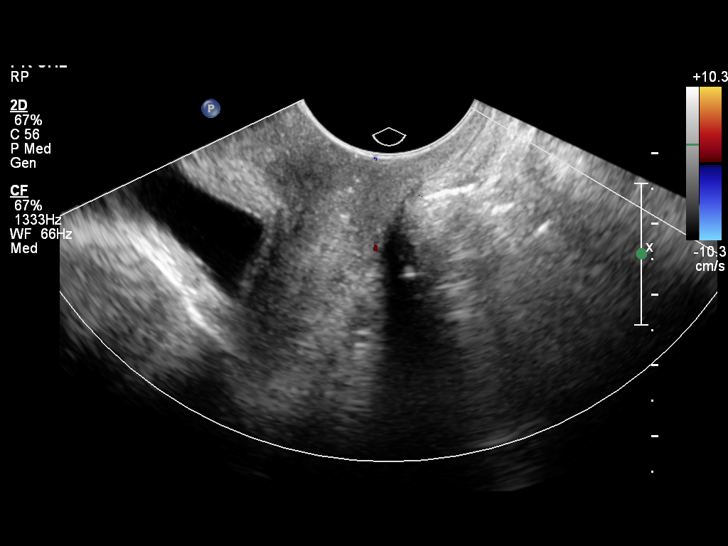

[14 of 25 positions shown; findings below may reference images not displayed]

FINDINGS: The uterus has a normal size and echotexture, measuring
7.6 x 4.2 x 5.5 cm.  The endometrial stripe is thin and
homogeneous, measuring 9 mm in width.  The previously identified
spontaneous abortion in progress located in the left vaginal fornix
is no longer identified.

Both ovaries have a normal size and appearance.  The right ovary
measures 3.2 x 2.2 x 2.7 cm, and the left ovary measures 2.9 x
x 2.0 cm.  There are no adnexal masses or free pelvic fluid.
IMPRESSION: Normal pelvic ultrasound.

## 2014-04-26 ENCOUNTER — Encounter (HOSPITAL_COMMUNITY): Payer: Self-pay | Admitting: *Deleted

## 2019-08-17 ENCOUNTER — Encounter (HOSPITAL_COMMUNITY): Payer: Self-pay | Admitting: Obstetrics and Gynecology

## 2019-08-17 ENCOUNTER — Inpatient Hospital Stay (HOSPITAL_COMMUNITY)
Admission: AD | Admit: 2019-08-17 | Discharge: 2019-08-17 | Disposition: A | Discharge status: Home / Self Care | Payer: Self-pay | Attending: Obstetrics and Gynecology | Admitting: Obstetrics and Gynecology

## 2019-08-17 ENCOUNTER — Inpatient Hospital Stay (HOSPITAL_COMMUNITY): Payer: Self-pay

## 2019-08-17 ENCOUNTER — Other Ambulatory Visit: Payer: Self-pay

## 2019-08-17 DIAGNOSIS — O26891 Other specified pregnancy related conditions, first trimester: Secondary | ICD-10-CM | POA: Insufficient documentation

## 2019-08-17 DIAGNOSIS — F329 Major depressive disorder, single episode, unspecified: Secondary | ICD-10-CM | POA: Insufficient documentation

## 2019-08-17 DIAGNOSIS — Z79899 Other long term (current) drug therapy: Secondary | ICD-10-CM | POA: Insufficient documentation

## 2019-08-17 DIAGNOSIS — R109 Unspecified abdominal pain: Secondary | ICD-10-CM | POA: Insufficient documentation

## 2019-08-17 DIAGNOSIS — B373 Candidiasis of vulva and vagina: Secondary | ICD-10-CM | POA: Insufficient documentation

## 2019-08-17 DIAGNOSIS — O99341 Other mental disorders complicating pregnancy, first trimester: Secondary | ICD-10-CM | POA: Insufficient documentation

## 2019-08-17 DIAGNOSIS — O209 Hemorrhage in early pregnancy, unspecified: Secondary | ICD-10-CM

## 2019-08-17 DIAGNOSIS — Z885 Allergy status to narcotic agent status: Secondary | ICD-10-CM | POA: Insufficient documentation

## 2019-08-17 DIAGNOSIS — O208 Other hemorrhage in early pregnancy: Secondary | ICD-10-CM | POA: Insufficient documentation

## 2019-08-17 DIAGNOSIS — B3731 Acute candidiasis of vulva and vagina: Secondary | ICD-10-CM

## 2019-08-17 DIAGNOSIS — Z3A01 Less than 8 weeks gestation of pregnancy: Secondary | ICD-10-CM | POA: Insufficient documentation

## 2019-08-17 DIAGNOSIS — O98811 Other maternal infectious and parasitic diseases complicating pregnancy, first trimester: Secondary | ICD-10-CM | POA: Insufficient documentation

## 2019-08-17 DIAGNOSIS — O418X10 Other specified disorders of amniotic fluid and membranes, first trimester, not applicable or unspecified: Secondary | ICD-10-CM

## 2019-08-17 DIAGNOSIS — Z3491 Encounter for supervision of normal pregnancy, unspecified, first trimester: Secondary | ICD-10-CM

## 2019-08-17 LAB — URINALYSIS, ROUTINE W REFLEX MICROSCOPIC
Bilirubin Urine: NEGATIVE
Glucose, UA: NEGATIVE mg/dL
Hgb urine dipstick: NEGATIVE
Ketones, ur: NEGATIVE mg/dL
Leukocytes,Ua: NEGATIVE
Nitrite: NEGATIVE
Protein, ur: NEGATIVE mg/dL
Specific Gravity, Urine: 1.02 (ref 1.005–1.030)
pH: 6 (ref 5.0–8.0)

## 2019-08-17 LAB — CBC
HCT: 37.7 % (ref 36.0–46.0)
Hemoglobin: 12.7 g/dL (ref 12.0–15.0)
MCH: 27.7 pg (ref 26.0–34.0)
MCHC: 33.7 g/dL (ref 30.0–36.0)
MCV: 82.1 fL (ref 80.0–100.0)
Platelets: 213 10*3/uL (ref 150–400)
RBC: 4.59 MIL/uL (ref 3.87–5.11)
RDW: 13.5 % (ref 11.5–15.5)
WBC: 7.6 10*3/uL (ref 4.0–10.5)
nRBC: 0 % (ref 0.0–0.2)

## 2019-08-17 LAB — WET PREP, GENITAL
Clue Cells Wet Prep HPF POC: NONE SEEN
Sperm: NONE SEEN
Trich, Wet Prep: NONE SEEN

## 2019-08-17 LAB — POCT PREGNANCY, URINE: Preg Test, Ur: POSITIVE — AB

## 2019-08-17 LAB — HIV ANTIBODY (ROUTINE TESTING W REFLEX): HIV Screen 4th Generation wRfx: NONREACTIVE

## 2019-08-17 LAB — HCG, QUANTITATIVE, PREGNANCY: hCG, Beta Chain, Quant, S: 63259 m[IU]/mL — ABNORMAL HIGH (ref <5)

## 2019-08-17 MED ORDER — TERCONAZOLE 80 MG VA SUPP: 80.0000 mg | Freq: Every day | VAGINAL | 0 refills | Status: DC

## 2019-08-17 NOTE — MAU Provider Note (Signed)
Chief Complaint: Vaginal Bleeding, Abdominal Pain, and Possible Pregnancy   First Provider Initiated Contact with Patient 08/17/19 1136     *Spanish interpreter at bedside for this encounter*  SUBJECTIVE HPI: Janice Bernard is a 39 y.o. [id number] at [redacted]w[redacted]d who presents to [facility] reporting abdominal pain and vaginal discharge. Symptoms started 4 days ago. Reports lower abdominal pain that wraps around to her low back. Also has noticed a brown discharge. No odor but does endorse vaginal itching. Denies n/v/d, dysuria, or vaginal bleeding.   Location: abdomen & back Quality: cramping Severity: 4/10 on pain scale Duration: 4 days Timing: intermittent Modifying factors: none Associated signs and symptoms: vaginal discharge & itching  Past Medical History:  Diagnosis Date  . Depressed    OB History  Gravida Para Term Preterm AB Living  4 1 1  0 2 1  SAB TAB Ectopic Multiple Live Births  2 0 0 0 1    # Outcome Date GA Lbr Len/2nd Weight Sex Delivery Anes PTL Lv  4 Current           3 Term 2006     CS-LVertical     2 SAB           1 SAB              Birth Comments: D&E for pregnancy loss at approximately [redacted] wks EGA   Past Surgical History:  Procedure Laterality Date  . CESAREAN SECTION    . DILATION AND EVACUATION  02/06/2011   Procedure: DILATATION AND EVACUATION (D&E);  Surgeon: Emeterio Reeve, MD;  Location: [facility];  Service: Gynecology;  Laterality: N/A;  . DILATION AND EVACUATION  02/22/2011   Procedure: DILATATION AND EVACUATION (D&E);  Surgeon: Emeterio Reeve, MD;  Location: [facility];  Service: Gynecology;  Laterality: N/A;   Social History   Socioeconomic History  . Marital status: Married    Spouse name: Not on file  . Number of children: Not on file  . Years of education: Not on file  . Highest education level: Not on file  Occupational History  . Not on file  Tobacco Use  . Smoking status: Never Smoker  . Smokeless tobacco: Never Used  Substance and  Sexual Activity  . Alcohol use: Yes    Alcohol/week: 3.0 standard drinks    Types: 3 Cans of beer per week    Comment: Social  . Drug use: No  . Sexual activity: Yes    Birth control/protection: None  Other Topics Concern  . Not on file  Social History Narrative  . Not on file   Social Determinants of Health   Financial Resource Strain:   . Difficulty of Paying Living Expenses: Not on file  Food Insecurity:   . Worried About Charity fundraiser in the Last Year: Not on file  . Ran Out of Food in the Last Year: Not on file  Transportation Needs:   . Lack of Transportation (Medical): Not on file  . Lack of Transportation (Non-Medical): Not on file  Physical Activity:   . Days of Exercise per Week: Not on file  . Minutes of Exercise per Session: Not on file  Stress:   . Feeling of Stress : Not on file  Social Connections:   . Frequency of Communication with Friends and Family: Not on file  . Frequency of Social Gatherings with Friends and Family: Not on file  . Attends Religious Services: Not on file  . Active Member of Clubs  or Organizations: Not on file  . Attends Archivist Meetings: Not on file  . Marital Status: Not on file  Intimate Partner Violence:   . Fear of Current or Ex-Partner: Not on file  . Emotionally Abused: Not on file  . Physically Abused: Not on file  . Sexually Abused: Not on file   Family History  Problem Relation Age of Onset  . Diabetes Paternal Uncle   . Cancer Father   . Anesthesia problems Neg Hx   . Other Neg Hx    No current facility-administered medications on file prior to encounter.   Current Outpatient Medications on File Prior to Encounter  Medication Sig Dispense Refill  . Prenatal Vit-Fe Fumarate-FA (MULTIVITAMIN-PRENATAL) 27-0.8 MG TABS tablet Take 1 tablet by mouth daily at 12 noon.    . ciprofloxacin (CIPRO) 500 MG tablet Take 1 tablet (500 mg total) by mouth 2 (two) times daily. 6 tablet 0  . miconazole (MONISTAT 7)  2 % vaginal cream Place 7 Applicatorfuls vaginally at bedtime. 45 g 0  . phenazopyridine (PYRIDIUM) 200 MG tablet Take 1 tablet (200 mg total) by mouth 3 (three) times daily as needed for pain. 10 tablet 0  . sertraline (ZOLOFT) 100 MG tablet Take 1.5 tablets (150 mg total) by mouth daily. 30 tablet 1   Allergies  Allergen Reactions  . Dilaudid [Hydromorphone Hcl] Itching and Rash    Pt states she is allergic to an IV pain medication but unable to recall name of drug  . Fentanyl Itching and Rash    Pt states she is allergic to an IV pain medication but unable to recall name of drug  . Morphine And Related Itching and Rash    Pt states she is allergic to an IV pain medication but unable to recall name of drug    I have reviewed patient's Past Medical Hx, Surgical Hx, Family Hx, Social Hx, medications and allergies.   Review of Systems  Constitutional: Negative.   Gastrointestinal: Positive for abdominal pain. Negative for constipation, diarrhea, nausea and vomiting.  Genitourinary: Positive for vaginal discharge. Negative for dysuria and vaginal bleeding.       + vaginal itching  Musculoskeletal: Positive for back pain.    OBJECTIVE Patient Vitals for the past 24 hrs:  BP Temp Temp src Pulse Resp SpO2 Height Weight  08/17/19 1104 123/74 98.2 F (36.8 C) Oral 89 16 100 % 5' (1.524 m) 55.9 kg   Constitutional: Well-developed, well-nourished female in no acute distress.  Cardiovascular: normal rate & rhythm, no murmur Respiratory: normal rate and effort. Lung sounds clear throughout GI: Abd soft, non-tender, Pos BS x 4. No guarding or rebound tenderness MS: Extremities nontender, no edema, normal ROM Neurologic: Alert and oriented x 4.  GU:     SPECULUM EXAM: NEFG, no blood. Small amount of tan discharge. Clumpy white discharge adherent to vaginal walls & cervix. Cervix not friable.   BIMANUAL: No CMT. cervix closed; uterus normal size, no adnexal tenderness or masses.    LAB  RESULTS Results for orders placed or performed during the hospital encounter of 08/17/19 (from the past 24 hour(s))  Pregnancy, urine POC     Status: Abnormal   Collection Time: 08/17/19 11:13 AM  Result Value Ref Range   Preg Test, Ur POSITIVE (A) NEGATIVE  Urinalysis, Routine w reflex microscopic     Status: Abnormal   Collection Time: 08/17/19 11:52 AM  Result Value Ref Range   Color, Urine YELLOW YELLOW  APPearance HAZY (A) CLEAR   Specific Gravity, Urine 1.020 1.005 - 1.030   pH 6.0 5.0 - 8.0   Glucose, UA NEGATIVE NEGATIVE mg/dL   Hgb urine dipstick NEGATIVE NEGATIVE   Bilirubin Urine NEGATIVE NEGATIVE   Ketones, ur NEGATIVE NEGATIVE mg/dL   Protein, ur NEGATIVE NEGATIVE mg/dL   Nitrite NEGATIVE NEGATIVE   Leukocytes,Ua NEGATIVE NEGATIVE  Wet prep, genital     Status: Abnormal   Collection Time: 08/17/19 11:52 AM   Specimen: Cervix  Result Value Ref Range   Yeast Wet Prep HPF POC PRESENT (A) NONE SEEN   Trich, Wet Prep NONE SEEN NONE SEEN   Clue Cells Wet Prep HPF POC NONE SEEN NONE SEEN   WBC, Wet Prep HPF POC MODERATE (A) NONE SEEN   Sperm NONE SEEN   CBC     Status: None   Collection Time: 08/17/19 12:12 PM  Result Value Ref Range   WBC 7.6 4.0 - 10.5 K/uL   RBC 4.59 3.87 - 5.11 MIL/uL   Hemoglobin 12.7 12.0 - 15.0 g/dL   HCT 37.7 36.0 - 46.0 %   MCV 82.1 80.0 - 100.0 fL   MCH 27.7 26.0 - 34.0 pg   MCHC 33.7 30.0 - 36.0 g/dL   RDW 13.5 11.5 - 15.5 %   Platelets 213 150 - 400 K/uL   nRBC 0.0 0.0 - 0.2 %    IMAGING US OB LESS THAN 14 WEEKS WITH OB TRANSVAGINAL  Result Date: 08/17/2019 CLINICAL DATA:  Pain and vaginal bleeding EXAM: OBSTETRIC <14 WK [place] AND TRANSVAGINAL OB US TECHNIQUE: Both transabdominal and transvaginal ultrasound examinations were performed for complete evaluation of the gestation as well as the maternal uterus, adnexal regions, and pelvic cul-de-sac. Transvaginal technique was performed to assess early pregnancy. COMPARISON:  None. FINDINGS:  Intrauterine gestational sac: Visualized Yolk sac:  Visualized Embryo:  Visualized Cardiac Activity: Visualized Heart Rate: 137 bpm CRL:  7 mm   6 w   3 d                  [place] EDC: April 08, 2020 Subchorionic hemorrhage: There is a subchorionic hemorrhage measuring 1.7 x 0.5 cm. Maternal uterus/adnexae: Cervical os is closed. There is a focal leiomyoma arising from the posterior fundus of the uterus measuring 5.7 x 4.6 x 3.9 cm. Right ovary measures 3.5 x 2.3 x 2.4 cm. Left ovary measures 1.7 x 3.4 x 2.2 cm. No extrauterine pelvic mass or free fluid. IMPRESSION: 1. Single live intrauterine gestation with estimated gestational age of 6+ weeks. 2.  Subchorionic hemorrhage measuring 1.7 x 0.5 cm. 3.  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 4.  No extrauterine pelvic mass or free pelvic fluid. Electronically Signed   By: Lowella Grip III M.D.   On: 08/17/2019 12:56    MAU COURSE Orders Placed This Encounter  Procedures  . Wet prep, genital  . US OB LESS THAN 14 WEEKS WITH OB TRANSVAGINAL  . Urinalysis, Routine w reflex microscopic  . CBC  . hCG, quantitative, pregnancy  . HIV Antibody (routine testing w rflx)  . Pregnancy, urine POC  . Discharge patient   Meds ordered this encounter  Medications  . terconazole (TERAZOL 3) 80 MG vaginal suppository    Sig: Place 1 suppository (80 mg total) vaginally at bedtime.    Dispense:  3 suppository    Refill:  0    Order Specific Question:   Supervising Provider  Answer:   ERVIN, MICHAEL L [[id number]]    MDM +UPT UA, wet prep, GC/chlamydia, CBC, ABO/Rh, quant hCG, and [place] today to rule out ectopic pregnancy which can be life threatening.   RH positive  Exam consistent with yeast & wet prep positive for yeast. Will prescribe terazol.   Ultrasound shows live IUP c/w LMP dating. [place] [facility] & uterine fibroid.   ASSESSMENT 1. Vaginal yeast infection   2. Vaginal bleeding in pregnancy, first trimester   3. Abdominal pain during  pregnancy in first trimester   4. Normal IUP (intrauterine pregnancy) on prenatal ultrasound, first trimester   5. Subchorionic hematoma in first trimester, single or unspecified fetus     PLAN Discharge home in stable condition. Bleeding precautions Rx terazol GC/CT & HIV pending Patient has appointment to start prenatal care next week, doesn't know the name of the office.  Allergies as of 08/17/2019      Reactions   Dilaudid [hydromorphone Hcl] Itching, Rash   Pt states she is allergic to an IV pain medication but unable to recall name of drug   Fentanyl Itching, Rash   Pt states she is allergic to an IV pain medication but unable to recall name of drug   Morphine And Related Itching, Rash   Pt states she is allergic to an IV pain medication but unable to recall name of drug      Medication List    STOP taking these medications   ciprofloxacin 500 MG tablet Commonly known as: Cipro   miconazole 2 % vaginal cream Commonly known as: Monistat 7   phenazopyridine 200 MG tablet Commonly known as: Pyridium   sertraline 100 MG tablet Commonly known as: Zoloft     TAKE these medications   multivitamin-prenatal 27-0.8 MG Tabs tablet Take 1 tablet by mouth daily at 12 noon.   terconazole 80 MG vaginal suppository Commonly known as: TERAZOL 3 Place 1 suppository (80 mg total) vaginally at bedtime.        Jorje Guild, NP 08/17/2019  1:25 PM

## 2019-08-17 NOTE — Discharge Instructions (Signed)
Infeccin mictica vaginal en los adultos Vaginal Yeast Infection, Adult  La infeccin mictica vaginal es una afeccin que causa secrecin vaginal y tambin dolor, hinchazn y enrojecimiento (inflamacin) de la vagina. Esta es una afeccin frecuente. Algunas mujeres contraen esta infeccin con frecuencia. Cules son las causas? La causa de la infeccin es un cambio en el equilibrio normal de los hongos (cndida) y las bacterias que viven en la vagina. Esta alteracin deriva en el crecimiento excesivo de los hongos, lo que causa la inflamacin. Qu incrementa el riesgo? La afeccin es ms probable en las mujeres que tienen estas caractersticas:  Toman antibiticos.  Tienen diabetes.  Toman anticonceptivos orales.  Estn embarazadas.  Se hacen duchas vaginales con frecuencia.  Tienen debilitado el sistema de defensa del organismo (sistema inmunitario).  Han estado tomando medicamentos con corticoesteroides durante mucho tiempo.  Usan ropa ajustada con frecuencia. Cules son los signos o sntomas? Los sntomas de esta afeccin incluyen:  Secrecin vaginal blanca, cremosa y espesa.  Hinchazn, picazn, enrojecimiento e irritacin de la vagina. Los labios de la vagina (vulva) tambin se pueden infectar.  Dolor o ardor al orinar.  Dolor durante las relaciones sexuales. Cmo se diagnostica? Esta afeccin se diagnostica en funcin de lo siguiente:  Sus antecedentes mdicos.  Un examen fsico.  Un examen plvico. El mdico examinar una muestra de la secrecin vaginal con un microscopio. Probablemente el mdico enve esta muestra al laboratorio para analizarla y confirmar el diagnstico. Cmo se trata? Esta afeccin se trata con medicamentos. Los medicamentos pueden ser recetados o de venta libre. Podrn indicarle que use uno o ms de lo siguiente:  Medicamentos que se toman por boca (orales).  Medicamentos que se aplican como una crema (tpicos).  Medicamentos que se  colocan directamente en la vagina (vulos vaginales). Siga estas instrucciones en su casa:  Estilo de vida  No tenga relaciones sexuales hasta que el mdico lo autorice. Comunique a su compaero sexual que tiene una infeccin por hongos. Esa persona debera visitar a su mdico y preguntarle si debera recibir tratamiento tambin.  No use ropa ajustada, como pantimedias o pantalones ajustados.  Use ropa interior de algodn, que permite el paso del aire. Instrucciones generales  Tome o aplquese los medicamentos de venta libre y los recetados solamente como se lo haya indicado el mdico.  Consuma ms yogur. Esto puede ayudar a evitar la recurrencia de la infeccin mictica.  No use tampones hasta que el mdico la autorice.  Intente darse un bao de asiento para aliviar las molestias. Se trata de un bao de agua tibia que se toma mientras se est sentada. El agua solo debe llegar hasta las caderas y cubrir las nalgas. Hgalo 3o 4veces al da o como se lo haya indicado el mdico.  No se haga duchas vaginales.  Si tiene diabetes, mantenga bajo control los niveles de azcar en la sangre.  Concurra a todas las visitas de seguimiento como se lo haya indicado el mdico. Esto es importante. Comunquese con un mdico si:  Tiene fiebre.  Los sntomas desaparecen y luego vuelven a aparecer.  Los sntomas no mejoran con el tratamiento.  Sus sntomas empeoran.  Aparecen nuevos sntomas.  Aparecen ampollas alrededor o adentro de la vagina.  Le sale sangre de la vagina y no est menstruando.  Siente dolor en el abdomen. Resumen  La infeccin mictica vaginal es una afeccin que causa secrecin y tambin dolor, hinchazn y enrojecimiento (inflamacin) de la vagina.  Esta afeccin se trata con medicamentos. Los   medicamentos pueden ser recetados o de venta libre.  Tome o aplquese los medicamentos de venta libre y los recetados solamente como se lo haya indicado el mdico.  No se haga  duchas vaginales. No tenga relaciones sexuales ni use tampones hasta que el mdico la autorice.  Comunquese con un mdico si los sntomas no mejoran con el tratamiento o si los sntomas desaparecen y [employer]. Esta informacin no tiene Marine scientist el consejo del mdico. Asegrese de hacerle al mdico cualquier pregunta que tenga. Document Revised: 02/24/2019 Document Reviewed: 02/24/2019 Elsevier Patient Education  [employer]ul de advertencia durante el embarazo Warning Signs During Pregnancy Generalmente, el embarazo dura unas 40 semanas, a partir del Passenger transport manager del ltimo perodo hasta que el beb nace. Se divide en tres fases llamadas trimestres.  El Psychologist, educational trimestre se refiere a Best boy 1 [employer] la semana 13 de [place].  El segundo trimestre es el comienzo de la semana 19 hasta el final de la semana 34.  El tercer trimestre es el comienzo de la semana 47 hasta el nacimiento del beb. Durante cada trimestre de embarazo, ciertos signos y sntomas pueden indicar un problema. Hable con su mdico acerca de su actual estado de salud y cualquier afeccin mdica que tenga. Asegrese de Assurant a los que debera estar atenta e informar. De qu modo la afecta a usted?  Signos de Oncologist trimestre de embarazo Si bien algunos cambios durante el primer trimestre pueden ser incmodos, la mayora no representa un problema grave. Informe al mdico si tiene alguno de los siguientes signos de advertencia durante Software engineer trimestre:  No puede comer ni beber sin vomitar, y esto se prolonga durante ms de Optician, dispensing.  Tiene sangrado o manchado vaginal junto con clicos parecidos a los de Hydrographic surveyor.  Tiene diarrea durante ms de Optician, dispensing.  Tiene fiebre u otros signos de infeccin, como: ? Social research officer, government o ardor al Garment/textile technologist. ? Olor ftido o secrecin vaginal espesa o amarillenta. Signos de advertencia en el segundo trimestre de embarazo A medida  que el beb crece y cambia durante el segundo trimestre, hay otros signos y sntomas que pueden indicar un problema. Estos incluyen los siguientes:  Signos y sntomas de infeccin, incluyendo [place].  Signos o sntomas de un aborto espontneo o un parto prematuro, por ejemplo, contracciones regulares, clicos parecidos a los de la menstruacin o dolor en la parte inferior del abdomen.  Secrecin vaginal acuosa o con sangre o sangrado vaginal obvio.  Sentir que el corazn late [place].  Dificultad para respirar.  Nuseas, vmitos o diarrea que dura ms de Optician, dispensing.  Deseo compulsivo de comer cosas que no son alimentos, tales como arcilla, tiza o [place]. Esto puede ser un signo de una afeccin mdica muy tratable llamada pica. Ms adelante, en el segundo trimestre, observe si tiene signos y sntomas de una enfermedad grave denominada preeclampsia.Estos incluyen los siguientes:  Cambios en la visin.  Un dolor de cabeza intenso que no se [place].  Nuseas y vmitos. Tambin es importante observar si el beb deja de moverse o se mueve menos que lo normal durante este trimestre. Signos de Human resources officer trimestre de embarazo A medida que se acerca el tercer trimestre del embarazo, el beb crece y su cuerpo se prepara para el nacimiento. En el tercer trimestre, asegrese de informarle a su mdico si:  Tiene signos y sntomas de infeccin, incluyendo fiebre.  Tiene  una hemorragia vaginal abundante.  Nota que su beb se mueve menos que lo habitual o no se mueve.  Tiene nuseas, vmitos o diarrea que dura ms de Optician, dispensing.  Siente un dolor de cabeza intenso que no se [place].  Tiene cambios en la visin, como ver manchas o tener visin borrosa o ver doble.  Aumenta la hinchazn en sus manos o rostro. De qu modo lo afecta al beb? Durante todo el Dick[place], siempre informe cualquier signo de advertencia de un problema al mdico. Esto puede ayudar a prevenir las complicaciones que  pueden afectar a su beb, por ejemplo:  Aumento del riesgo de nacimiento prematuro.  Infecciones que pueden transmitirse al beb.  Aumento del riesgo de muerte fetal. Comunquese con un mdico si:  Tiene cualquier signo de advertencia de un problema para el actual trimestre de su embarazo.  Cualquiera de lo siguiente se aplica a usted durante cualquier trimestre del embarazo: ? Tiene emociones fuertes, como tristeza o ansiedad, que interfieren con el trabajo o las relaciones personales. ? Se siente insegura en su casa y necesita ayuda para encontrar un lugar seguro para vivir. ? [place] productos de tabaco, alcohol o drogas y [place] para dejar de hacerlo. Solicite ayuda de inmediato si: Tiene signos o sntomas de trabajo de parto antes de las 37 semanas de [place]. Estos incluyen los siguientes:  Contracciones separadas unas de otras por intervalos de 5 minutos o menos, o que aumentan en frecuencia, intensidad o duracin.  Dolor abdominal sbito y agudo o Fish farm manager.  Chorro repentino o goteo constante de lquido proveniente de la vagina. Resumen  Generalmente, el embarazo dura unas 40 semanas, a partir del Passenger transport manager del ltimo perodo hasta que el beb nace. Se divide en tres fases llamadas trimestres. Cada trimestre tiene signos de advertencia a los que debe estar atenta.  Siempre informe cualquier signo de advertencia a su mdico para evitar las complicaciones que pueden afectar tanto a usted como a su beb.  Hable con su mdico acerca de su actual estado de salud y cualquier afeccin mdica que tenga. Asegrese de [employer]ant a los que debera estar atenta e informar. Esta informacin no tiene Marine scientist el consejo del mdico. Asegrese de hacerle al mdico cualquier pregunta que tenga. Document Revised: 06/26/2017 Document Reviewed: 06/26/2017 Elsevier Patient Education  2020 [employer].

## 2019-08-17 NOTE — MAU Note (Signed)
Pain in lower abd, started 2 days ago.  Comes and goes. ? Bleeding, dark fluid started 3 days ago, spots. 2+HPT 3 wks ago.  Hx of miscarriage, nervous.

## 2019-08-19 ENCOUNTER — Ambulatory Visit: Payer: Self-pay | Admitting: Emergency Medicine

## 2019-08-19 LAB — GC/CHLAMYDIA PROBE AMP (~~LOC~~) NOT AT ARMC
Chlamydia: NEGATIVE
Comment: NEGATIVE
Comment: NORMAL
Neisseria Gonorrhea: NEGATIVE

## 2019-08-20 ENCOUNTER — Encounter: Payer: Self-pay | Admitting: Emergency Medicine

## 2019-10-19 ENCOUNTER — Other Ambulatory Visit: Payer: Self-pay

## 2019-10-19 ENCOUNTER — Ambulatory Visit (INDEPENDENT_AMBULATORY_CARE_PROVIDER_SITE_OTHER): Payer: Self-pay | Admitting: *Deleted

## 2019-10-19 ENCOUNTER — Encounter: Payer: Self-pay | Admitting: *Deleted

## 2019-10-19 DIAGNOSIS — O09529 Supervision of elderly multigravida, unspecified trimester: Secondary | ICD-10-CM | POA: Insufficient documentation

## 2019-10-19 DIAGNOSIS — D219 Benign neoplasm of connective and other soft tissue, unspecified: Secondary | ICD-10-CM | POA: Insufficient documentation

## 2019-10-19 DIAGNOSIS — Z8632 Personal history of gestational diabetes: Secondary | ICD-10-CM | POA: Insufficient documentation

## 2019-10-19 DIAGNOSIS — O0990 Supervision of high risk pregnancy, unspecified, unspecified trimester: Secondary | ICD-10-CM | POA: Insufficient documentation

## 2019-10-19 DIAGNOSIS — E0590 Thyrotoxicosis, unspecified without thyrotoxic crisis or storm: Secondary | ICD-10-CM

## 2019-10-19 DIAGNOSIS — Z98891 History of uterine scar from previous surgery: Secondary | ICD-10-CM

## 2019-10-19 NOTE — Progress Notes (Signed)
I connected with  Janice Bernard on 10/19/19 at  2:30 PM EDT by telephone with Interpreter and verified that I am speaking with the correct person using two identifiers.   I discussed the limitations, risks, security and privacy concerns of performing an evaluation and management service by telephone and the availability of in person appointments. I also discussed with the patient that there may be a patient responsible charge related to this service. The patient expressed understanding and agreed to proceed.  I explained I am completing her New OB Intake today. We discussed Her EDD and that it is based on  sure LMP . I reviewed her allergies, meds, OB History, Medical /Surgical history, and appropriate screenings. She reports she was seeing someone at [facility] and was taking medicine for depression but stopped when found out pregnant.  I informed her of [facility] services. She had negative PHQ9 and GAD 7 today.  She reports she was using " next day pills to prevent pregnancy 2-3 times a month when she got pregnant this time.    I explained we will ask her to take her blood pressure during the pregnancy. She confirms she has a cuff.   I asked her to bring the blood pressure cuff with her to her first ob appointment so we can make sure it works properly and that she knows  how to use it. Explained  then we will have her take her blood pressure weekly and record. I explained she will have some visits in office and some virtually. She has not done a virtual visit before and I offered to assist her downloading app and she prefers to do in office with assistance.  I reviewed her new ob  appointment date/ time with her , our location and to wear mask, no visitors.  I explained she will have a pelvic exam, ob bloodwork, hemoglobin a1C, cbg ,pap, and  genetic testing if desired,- she is undecided about panorama. I scheduled an [place] at 19 weeks with [place]   On 11/16/19 at 0900 and gave her the appointment. She voices  understanding.  Aleda Madl,RN 10/19/2019  2:35 PM

## 2019-10-19 NOTE — Patient Instructions (Signed)
At [facility] [employer], we work as an integrated team, providing care to address both physical and emotional health. Your medical provider may refer you to see our [facility]) on the same day you see your medical provider, as availability permits.  Our [facility] is available to all patients, visits generally last between 20-30 minutes, but can be longer or shorter, depending on patient need. The [facility] offers help with stress management, coping with symptoms of depression and anxiety, major life changes , sleep issues, changing risky behavior, grief and loss, life stress, working on personal life goals, and  behavioral health issues, as these all affect your overall health and wellness.  The [facility] is NOT available for the following: court-ordered evaluations, specialty assessments (custody or disability), letters to employers, or obtaining certification for an emotional support animal. The [facility] does not provide long-term therapeutic services. You have the right to refuse integrated behavioral health services, or to reschedule to see the [facility] at a later date.  Exception: If you are having thoughts of suicide, we require that you either see the [facility] for further assessment, or contract for safety with your medical provider prior to checking out.  Confidentiality exception: If it is suspected that a child or disabled adult is being abused or neglected, we are required by law to report that to either Child Protective Services or Adult Scientist, forensic.  If you have a diagnosis of Bipolar affective disorder, Schizophrenia, or recurrent Major depressive disorder, we will recommend that you establish care with a psychiatrist, as these are lifelong, chronic conditions, and we want your overall emotional health and medications to be more closely monitored. If you anticipate needing extended maternity leave due to mental illness, it it recommended that you find a psychiatrist as soon as possible.  Neither the medical provider, nor the [facility], can recommend an extended maternity leave for mental health issues. Your medical provider or [facility] may refer you to a therapist for ongoing, traditional therapy, or to a psychiatrist, for medication management, if it would benefit your overall health. Depending on your insurance, you may have a copay to see the [facility]. If you are uninsured, it is recommended that you apply for financial assistance. (Forms may be requested at the front desk for in-person visits, via MyChart, or request a form during a virtual visit).  If you see the [facility] more than 6 times, you will have to complete a comprehensive clinical assessment interview with the [facility] to resume integrated services.  Any questions?   [facility] [address] [place], [place] [address] [phone]   or  www.http://james-garner.info/ **SNAP/EBT/ Other nutritional benefits  [facility] [address], [place], [place] [address] [phone]  or  https://palmer-smith.com/ **WIC for  women who are pregnant and postpartum, infants and children up to 77 years old  [place] [address] [place], [place] [address] [phone]   or   www.theblessedtable.org  **Food pantry  Brother Kolbe's [address], [place], [place] [address] [phone]   or   https://brotherkolbes.godaddysites.com  **Emergency food and prepared meals  [place] [address], [place], [place] [address] [phone]   or   www.cedargrovetop.us **Food pantry  [place] [facility] [address] [place],  [address] [phone]   or   www.https://hartman-jones.net/ **Food pantry  [facility]  Food Pantry [address]  [place], [place] [address] [phone] **Food pantry  [facility] [address] [place], [place] [address] [phone]   or   www.greensborourbanministry.org  Insurance underwriter and prepared meals  [facility] [place] [address] [place], [place], [place], [place] [address] DomainerFinder.be  **Food pantry  [place] [employer] Food Pantry [address] [place], [place] [address] [phone]   or   www.lbcnow.org  **Food pantry  [facility] [place] [facility] [address], [place], [place] [address] [phone]   or   http://patterson-parker.net/ **Food pantry, nutrition education, gardening activities  [place] [address], [place], [place] [address] [phone] **Food pantry  [facility] [place] [address], [place], [place] [address] [phone]   or   www.salvationarmyofgreensboro.Lovette Cliche of [address] [place], [place], [place] [address] [phone]   or   http://senior-resources-guilford.org [facility] on [place] [address] [place], [place] [address] [phone]   or   www.stmattchurch.com  **Food pantry  [place] [address] [place], [place] [address] [phone]   or   vandaliapresbyterianchurch.org **Food pantry  [place] [facility] [address] [place], [place], [place] [address] [phone]   or   www.Mormon101.be Food Civil Service fast streamer of [place] [address] Jacinto Reap [place], [place] [address] [phone] **Food pantry  [place] Resources   Department of [facility] [address] [place], [place] [address] [phone]   or   www.co.[place].us/ph/  [place] [facility]) [address], [place], [place] [address] [phone]   or   MedicationWebsites.com.au **WIC for pregnant and postpartum women, infants and children up to 33 years old  [facility] Pantry [address], [place], [place] [address] [phone] **Food pantry  [place] [address] [place], [place] [address] [phone]   or   emerywoodbaptistchurch.com *Food pantry  [employer] lo[employer] Food Pantry [address], [place], [place] [address] [phone]   or   www.fcchighpoint.Radonna RickerFood pantry  [facility] [address], [place], [place] [address] [phone]   or   http://www.green.com/ **Food pantry  [place] [address], [place], [place] [address] [phone]   or   www.facebook.com/KBCI1 Social worker of [place] [address], [place], [place] [address] [phone]   or   www.abbottscreek.org [employer] System of [place] [phone]   **Delivers meals  New Beginnings Full [facility] [address], [place], [place] [address] [phone]   or   nbfgm.sundaystreamwebsites.com  Programme researcher, broadcasting/film/video of [employer] [address] [place], [place] [address] [phone]   or   www.odm-hp.org  **Food pantry  [place] [address] [place], [place] [address] [phone]   or   R2live.tv **Food pantry  [place] [address], [place], [place] [address] [phone]   or   ClubMonetize.fr **Emergency food and pet food  Senior [place] [address] [place], [place] [address] [phone]   or   www.senioradults.org **Congregate and delivered meals to older adults  [place] Way of [place] [address] [place],  [place] [address] [phone]  or   SamedayNews.com.cy **Back Pack Program for elementary school students  [place] [address], [place], [place] [address] [phone]   or   www.wardstreetcommunityresources.org **Food pantry  [facility] [address], [place], [place] [address] [phone]   or   https://www.carlson.net/ **Emergency food, nutrition classes, food budgeting  Food Resources [place]  [address] [place], [place], [place] [address] [phone]  or   www.co.[place].us/pview.aspx?id=[email or web address]&catid=407 **SNAP/Other nutrition benefits  [place], [place], [place] [address] [phone]   or  http://goodwin-walker.biz/  **SNAP/Other nutrition benefits  [place], [place], [place] [address] [phone]  or  http://www.rockinghamcountypublichealth.org **WIC for pregnant and postpartum women, infants and children up to 37 years old  Aging, Disability and [place] [address], [place], [place] [address] [phone]  or www.risodejaneiro.com **Prepared meals for older adults  [place] [address], [place], [place] [address] [phone]  or  NetworkingMixer.com.ee **Food pantry  Hands of [facility] [address], [place], [place] [address] [phone]   or   https://www.handsofgod.org/  Insurance underwriter  Men in [place] [address] [place], [place] [address] [phone] **Food pantry  [facility] [place], [place], [place] [address] [phone]   or   www.ci.[place].us/government/parks_and_recreation/senior_center/index.php **Congregate meal for older adults  [facility] [address] [place], [place] [address] [phone]   or www.reidsvilleoutreachcenter.org  **Food pantry  [facility] [address] [place], [place] [address] [phone]   or   http://reid-chang.com/ **Food pantry

## 2019-11-04 ENCOUNTER — Other Ambulatory Visit (HOSPITAL_COMMUNITY)
Admission: RE | Admit: 2019-11-04 | Discharge: 2019-11-04 | Disposition: A | Discharge status: Home / Self Care | Payer: Self-pay | Source: Ambulatory Visit | Attending: Women's Health | Admitting: Women's Health

## 2019-11-04 ENCOUNTER — Ambulatory Visit (INDEPENDENT_AMBULATORY_CARE_PROVIDER_SITE_OTHER): Payer: Self-pay | Admitting: Women's Health

## 2019-11-04 ENCOUNTER — Other Ambulatory Visit: Payer: Self-pay

## 2019-11-04 ENCOUNTER — Encounter: Payer: Self-pay | Admitting: Women's Health

## 2019-11-04 VITALS — BP 123/80 | HR 88 | Wt 128.6 lb

## 2019-11-04 DIAGNOSIS — O99282 Endocrine, nutritional and metabolic diseases complicating pregnancy, second trimester: Secondary | ICD-10-CM

## 2019-11-04 DIAGNOSIS — O0990 Supervision of high risk pregnancy, unspecified, unspecified trimester: Secondary | ICD-10-CM

## 2019-11-04 DIAGNOSIS — Z98891 History of uterine scar from previous surgery: Secondary | ICD-10-CM

## 2019-11-04 DIAGNOSIS — O34219 Maternal care for unspecified type scar from previous cesarean delivery: Secondary | ICD-10-CM

## 2019-11-04 DIAGNOSIS — Z8632 Personal history of gestational diabetes: Secondary | ICD-10-CM

## 2019-11-04 DIAGNOSIS — O09522 Supervision of elderly multigravida, second trimester: Secondary | ICD-10-CM

## 2019-11-04 DIAGNOSIS — E0590 Thyrotoxicosis, unspecified without thyrotoxic crisis or storm: Secondary | ICD-10-CM

## 2019-11-04 DIAGNOSIS — Z3A17 17 weeks gestation of pregnancy: Secondary | ICD-10-CM

## 2019-11-04 NOTE — Progress Notes (Signed)
History:   Janice Bernard is a 39 y.o. [id number] at [redacted]w[redacted]d by LMP being seen today for her first obstetrical visit.  Her obstetrical history is significant for hx of C/S and hyperthyroid. Patient does intend to breast feed. Pregnancy history fully reviewed.  Pt reports this is a desired and planned pregnancy. Allergies: fentanyl, dilaudid, morphine Current Medications: PNVs PMH: hyperthyroid. No HTN, DM, asthma. PSH: C/S x1 - performed in [facility] OB Hx: 2006 - C/S, term; 2012 SAB x2 Social Hx: pt does not smoke, drink, or use drugs. Family Hx: none  Patient reports no complaints.      HISTORY: OB History  Gravida Para Term Preterm AB Living  4 1 1  0 2 1  SAB TAB Ectopic Multiple Live Births  2 0 0 0 1    # Outcome Date GA Lbr Len/2nd Weight Sex Delivery Anes PTL Lv  4 Current           3 SAB 06/2011          2 SAB 02/2011 [redacted]w[redacted]d            Birth Comments: D&E for pregnancy loss at approximately [redacted] wks EGA  1 Term 2006   6 lb 7 oz (2.92 kg)  CS-LVertical   LIV     Birth Comments: C/S due to  FTP; GDM; Placenta issues    Last pap smear was done 2012 and was normal  Past Medical History:  Diagnosis Date  . Depressed   . Fibroid    noted on [place] 07/2019  . Hyperthyroidism    Past Surgical History:  Procedure Laterality Date  . CESAREAN SECTION    . DILATION AND EVACUATION  02/06/2011   Procedure: DILATATION AND EVACUATION (D&E);  Surgeon: Emeterio Reeve, MD;  Location: [facility];  Service: Gynecology;  Laterality: N/A;  . DILATION AND EVACUATION  02/22/2011   Procedure: DILATATION AND EVACUATION (D&E);  Surgeon: Emeterio Reeve, MD;  Location: [facility];  Service: Gynecology;  Laterality: N/A;   Family History  Problem Relation Age of Onset  . Diabetes Paternal Uncle   . Cancer Father   . Throat cancer Father   . Anesthesia problems Neg Hx   . Other Neg Hx    Social History   Tobacco Use  . Smoking status: Never Smoker  . Smokeless tobacco: Never Used  Substance Use Topics  .  Alcohol use: Not Currently    Alcohol/week: 3.0 standard drinks    Types: 3 Cans of beer per week    Comment: Social  . Drug use: No   Allergies  Allergen Reactions  . Dilaudid [Hydromorphone Hcl] Itching and Rash    Pt states she is allergic to an IV pain medication but unable to recall name of drug  . Fentanyl Itching and Rash    Pt states she is allergic to an IV pain medication but unable to recall name of drug  . Morphine And Related Itching and Rash    Pt states she is allergic to an IV pain medication but unable to recall name of drug   Current Outpatient Medications on File Prior to Visit  Medication Sig Dispense Refill  . Cetirizine HCl (ZYRTEC ALLERGY PO) Take 1 tablet by mouth daily.    . Prenatal Vit-Fe Fumarate-FA (MULTIVITAMIN-PRENATAL) 27-0.8 MG TABS tablet Take 1 tablet by mouth daily at 12 noon.    . Prenatal Vit-Fe Fumarate-FA (PRENATAL VITAMINS PO) Take 1 tablet by mouth daily.    Marland Kitchen terconazole (  TERAZOL 3) 80 MG vaginal suppository Place 1 suppository (80 mg total) vaginally at bedtime. 3 suppository 0   No current facility-administered medications on file prior to visit.    Review of Systems Pertinent items noted in HPI and remainder of comprehensive ROS otherwise negative. Physical Exam:   Vitals:   11/04/19 0919  BP: 123/80  Pulse: 88  Weight: 128 lb 9.6 oz (58.3 kg)   Fetal Heart Rate (bpm): 147 Uterus:    FH at umbilicus  Pelvic Exam: Perineum: no hemorrhoids, normal perineum   Vulva: normal external genitalia, no lesions   Vagina:  normal mucosa, normal discharge   Cervix: no lesions and normal, pap smear done.    Adnexa: normal adnexa and no mass, fullness, tenderness   Bony Pelvis: average  System: General: well-developed, well-nourished female in no acute distress   Breasts:  normal appearance, no masses or tenderness bilaterally   Skin: normal coloration and turgor, no rashes   Neurologic: oriented, normal, negative, normal mood    Extremities: normal strength, tone, and muscle mass, ROM of all joints is normal   HEENT PERRLA, extraocular movement intact and sclera clear, anicteric   Mouth/Teeth mucous membranes moist, pharynx normal without lesions and dental hygiene good   Neck supple and no masses   Cardiovascular: regular rate and rhythm   Respiratory:  no respiratory distress, normal breath sounds   Abdomen: soft, non-tender; bowel sounds normal; no masses,  no organomegaly     Assessment:    Pregnancy: [id number] Patient Active Problem List   Diagnosis Date Noted  . Supervision of high risk pregnancy, antepartum 10/19/2019  . AMA (advanced maternal age) multigravida 35+ 10/19/2019  . History of gestational diabetes mellitus (GDM) 10/19/2019  . Fibroid   . Hyperthyroidism   . History of cesarean section   . Incomplete miscarriage 05/03/2011     Plan:    1. Supervision of high risk pregnancy, antepartum - Obstetric Panel, Including HIV - Hemoglobin A1c - GC/Chlamydia probe amp ([place])not at [facility] - [place] - Hepatitis C Antibody - Culture, OB Urine - Cytology - PAP( [place]) - anatomy scan scheduled with [facility]  2. Hyperthyroidism - TSH - T4, free - T4 - asymptomatic per pt  3. Multigravida of advanced maternal age in second trimester - age 46 at time of delivery  4. History of cesarean section - desires TOLAC  5. History of gestational diabetes mellitus (GDM) - A1C done today   Initial labs drawn. Continue prenatal vitamins. Genetic Screening discussed, NIPS: declined. Ultrasound discussed; fetal anatomic survey: requested. Problem list reviewed and updated. The nature of [facility] with multiple MDs and other Advanced Practice Providers was explained to patient; also emphasized that residents, students are part of our team. Routine obstetric precautions reviewed. Return in about 4 weeks (around 12/02/2019) for [facility] - MD - hyperthyroid/TOLAC.      Clarisa Fling, NP  10:25 AM 11/04/2019

## 2019-11-04 NOTE — Progress Notes (Signed)
Sent mychart set up Added food Resources in instructions

## 2019-11-04 NOTE — Patient Instructions (Addendum)
[place] Food Resources  Department of Social Services-[place] [address], [place], [place] [address] [phone]   or  www.[place].gov/[place]-county/human-services/social-services **SNAP/EBT/ Other nutritional benefits  [place] DHHS-Public Health-WIC [address], [place], [place] [address] [phone]  or  https://[place].gov/[place]-county/human-services/health-department **WIC for  women who are pregnant and postpartum, infants and children up to 5 years old  [facility] [address], [place], [place] [address] [phone]   or   www.theblessedtable.org  **Food pantry  [facility] [address], [place], [place] [address] [phone]   or   https://brotherkolbes.godaddysites.com  **Emergency food and prepared meals  [place] [facility] [address], [place], [place] [address] [phone]   or   www.cedargrovetop.us **Food pantry  [facility] Food [facility] [address], [place], [place] [address] [phone]   or   www.facebook.com/[facility]-116430931718202 **Food pantry  [facility] [address], [place], [place] [address] [phone] **Food pantry  [facility] [address], [place], [place] [address] [phone]   or   www.greensborourbanministry.org  **Food pantry and prepared meals  [facility] [address], [place], [place] [address] https://jfsgreensboro.org/  **Food pantry  [place] [employer] [facility] [address], [place], [place] [address] [phone]   or   www.lbcnow.org  **Food pantry  [facility] [address], [place], [place] [address] [phone]   or   http://www.onestepfurther.com **Food pantry, nutrition education, gardening activities  [facility] [employer] [facility] [address], [place], [place] [address] [phone] **Food pantry  [facility] [place] [address], [place], [place] [address] [phone]   or   www.salvationarmyofgreensboro.org **Food pantry  [facility] [place] [address], [place], [place] [address] [phone]   or   http://senior-resources-[place].org **Meals on Wheels Program  [place] [address], [place], [place] [address] [phone]   or   www.stmattchurch.com  **Food pantry  [facility] Food [facility] [address], [place], [place] [address] [phone]   or   vandaliapresbyterianchurch.org **Food pantry  [place] Food Resources  [place] [facility] Food [facility] [address], [place], [place] [address] [phone]   or   www.facebook.com/JulianUMC Food pantry  [facility] [address], [place], [place] [address] [phone] **Food pantry  [place] [facility] Food Resources   Department of Public Health[place] [address], [place], [place] [address] [phone]   or   www.co.[place].us/ph/  [place] DHHS-Public Health-WIC ([place]) [address], [place], [place] [address] [phone]   or   https://www.[place].gov/our-county/human-services/health-department **WIC for pregnant and postpartum women, infants and children up to 5 years old  [facility] [address], [place], [place] [address] [phone] **Food pantry  [facility] [address], [place], [place] [address] [phone]   or   emerywoodbaptistchurch.com *Food pantry  [employer] [facility] [address], [place],  [address] [phone]   or   www.fcchighpoint.org **Food pantry  [facility] [address], [place],  [address] [phone]   or    www.helpinghandshp.org **Food pantry  [facility] [employer] [facility] Food [facility] [address], [place],  [address] [phone]   or   www.facebook.com/KBCI1 **Food Pantry  [employer] [facility] [address] [place] [facility]   [address] [place], [place] [address] [phone]   or   www.abbottscreek.org [employer] [facility] [place] [phone]   **Delivers meals  [facility] [address] [place], [place] [address] [phone]   or   nbfgm.sundaystreamwebsites.com  Programme researcher, broadcasting/film/video of [employer] [address], [place], [place] [address] [phone]   or   www.odm-hp.org  **Food pantry  [place] [address], [place], [place] [address] [phone]   or   R2live.tv **Food pantry  [place] [address] [place], [place] [address] [phone]   or   ClubMonetize.fr **Emergency food and pet food  [facility] [place] [address] [place], [place] [address] [phone]   or   www.senioradults.org **Congregate and delivered meals to older adults  [place] [facility] [employer] [address], [place], [place] [address] [phone]   or   SamedayNews.com.cy **Back Pack Program for elementary school students  [place] [address] [place], [place] [address] [phone]   or   www.wardstreetcommunityresources.org **Food pantry  [facility] [address] [place], [place] [address] [phone]   or   https://www.carlson.net/ **Emergency food, nutrition classes, food budgeting  Food Resources [place]  [address] [place], [place], [place] [address] [phone]  or   www.co.[place].Pa[place]ilo.us/pview.aspx?id=14850&catid=407 **SNAP/Other nutrition benefits  [place] [address], [place], [place] [address] [phone]   or  http://goodwin-walker.biz/  **SNAP/Other nutrition benefits  [place] [address] [place], [place], [place] [address] [phone]  or  http://www.[place]countypublichealth.org **WIC for pregnant and postpartum women, infants and children up to 99 years old  Aging, Disability and [place] [address], [place], [place] [address] [phone]  or www.risodejaneiro.com **Prepared meals for older adults  [place] [address], [place], [place] [address] [phone]  or  NetworkingMixer.com.ee **Food pantry  [facility] [address], [place], [place] [address] [phone]   or   https://www.handsofgod.org/  Insurance underwriter  [facility] [place] [address] [place], [place] [address] [phone] **Food pantry  [facility] for [place] [address], [place], [place] [address] [phone]   or   www.ci.[place].us/government/parks_and_recreation/senior_center/index.php **Congregate meal for older adults  [facility] [address] [place], [place] [address] [phone]   or www.reidsvilleoutreachcenter.org  **Food pantry  [facility] [address], [place], [place] [address] [phone]   or   http://reid-chang.com/ **Food pantry    Maternity Assessment Unit (MAU)  The Maternity Assessment Unit (MAU) is located at the [facility] [place] [facility]. The address is: [address] [place], [place], [place] [address]. Please see map below for additional directions.    The Maternity Assessment Unit is designed to help you during your pregnancy, and for up to 6 weeks after delivery, with any pregnancy- or postpartum-related emergencies, if you think you are  in labor, or if your water has broken. For example, if you experience nausea and vomiting, vaginal bleeding, severe abdominal or pelvic pain, elevated blood pressure or other problems related to your pregnancy or postpartum time, please come to the Maternity Assessment Unit for assistance.     [employer]  seguras para tomar Solicitor  Safe Medications in Pregnancy  Acn:  Benzoyl Peroxide (Perxido de benzolo)  Salicylic Acid (cido saliclico)  Dolor de espalda/Dolor de cabeza:  Tylenol: 2 pastillas de concentracin regular cada 4 horas O 2 pastillas de concentracin fuerte cada 6 horas  Resfriados/Tos/Alergias:  Benadryl (sin alcohol) 25 mg cada 6 horas segn lo necesite Breath Right strips (Tiras para respirar correctamente)  Claritin  Cepacol (pastillas de chupar para la garganta)  Chloraseptic (aerosol para la garganta)  Cold-Eeze- hasta tres veces por da  Cough drops (pastillas de chupar para la tos, sin alcohol)  Flonase (con receta mdica solamente)  Guaifenesin  Mucinex  Robitussin DM (simple solamente, sin alcohol)  Saline nasal spray/drops (Aerosol nasal salino/gotas) Sudafed (pseudoephedrine) y  Actifed * utilizar slo despus de 12 semanas de gestacin y si no tiene la presin arterial alta.  Tylenol Vicks  VapoRub  Zinc lozenges (pastillas para la garganta)  Zyrtec  Estreimiento:  Colace  Ducolax (supositorios)  Fleet enema (lavado intestinal rectal)  Glycerin (supositorios)  Metamucil  Milk of magnesia (leche de magnesia)  Miralax  Senokot  Smooth Move (t)  Diarrea:  Kaopectate Imodium A-D  *NO tome Pepto-Bismol  Hemorroides:  Anusol  Anusol HC  Preparation H  Tucks  Indigestin:  Tums  Maalox  Mylanta  Zantac  Pepcid  Insomnia:  Benadryl (sin alcohol) 25mg  cada 6 horas segn lo necesite  Tylenol PM  Unisom, no Gelcaps  [place]ambres en las piernas:  Tums  MagGel Nuseas/Vmitos:  [place]  Dramamine  Emetrol  Ginger  (extracto)  Sea-Bands  Meclizine  Medicina para las nuseas que puede tomar durante el embarazo: Unisom (doxylamine succinate, pastillas de 25 mg) Tome una pastilla al da al [place]. Si los sntomas no estn adecuadamente controlados, la dosis puede aumentarse hasta una dosis mxima recomendada de [employer] al da (1/2 pastilla por la [place], 1/2 pastilla a media tarde y Ardelia Mems pastilla al [place]). Pastillas de Vitamina B6 de 100mg . Tome [place] [employer] veces al da (hasta 200 mg por da).  Erupciones en la piel:  Productos de Aveeno  Benadryl cream (crema o una dosis de 25mg  cada 6 horas segn lo necesite)  Calamine Lotion (locin)  1% cortisone cream (crema de cortisona de 1%)  nfeccin vaginal por hongos (candidiasis):  Gyne-lotrimin 7  Monistat 7   **Si est tomando varias medicinas, por favor revise las etiquetas para Conservation officer, nature los mismos ingredientes [place]. **Tome la medicina segn lo indicado en la etiqueta. **No tome ms de 400 mg de Tylenol en 24 horas. **No tome medicinas que contengan aspirina o ibuprofeno.

## 2019-11-05 LAB — OBSTETRIC PANEL, INCLUDING HIV
Antibody Screen: NEGATIVE
Basophils Absolute: 0.1 10*3/uL (ref 0.0–0.2)
Basos: 1 %
EOS (ABSOLUTE): 0.2 10*3/uL (ref 0.0–0.4)
Eos: 2 %
HIV Screen 4th Generation wRfx: NONREACTIVE
Hematocrit: 34.8 % (ref 34.0–46.6)
Hemoglobin: 11.9 g/dL (ref 11.1–15.9)
Hepatitis B Surface Ag: NEGATIVE
Immature Grans (Abs): 0.1 10*3/uL (ref 0.0–0.1)
Immature Granulocytes: 1 %
Lymphocytes Absolute: 1.5 10*3/uL (ref 0.7–3.1)
Lymphs: 15 %
MCH: 29.1 pg (ref 26.6–33.0)
MCHC: 34.2 g/dL (ref 31.5–35.7)
MCV: 85 fL (ref 79–97)
Monocytes Absolute: 0.6 10*3/uL (ref 0.1–0.9)
Monocytes: 5 %
Neutrophils Absolute: 7.9 10*3/uL — ABNORMAL HIGH (ref 1.4–7.0)
Neutrophils: 76 %
Platelets: 227 10*3/uL (ref 150–450)
RBC: 4.09 x10E6/uL (ref 3.77–5.28)
RDW: 14.1 % (ref 11.7–15.4)
RPR Ser Ql: NONREACTIVE
Rh Factor: POSITIVE
Rubella Antibodies, IGG: 4.39 index (ref >0.99)
WBC: 10.3 10*3/uL (ref 3.4–10.8)

## 2019-11-05 LAB — HEMOGLOBIN A1C
Est. average glucose Bld gHb Est-mCnc: 108 mg/dL
Hgb A1c MFr Bld: 5.4 % (ref 4.8–5.6)

## 2019-11-05 LAB — T4: T4, Total: 10 ug/dL (ref 4.5–12.0)

## 2019-11-05 LAB — GC/CHLAMYDIA PROBE AMP (~~LOC~~) NOT AT ARMC
Chlamydia: NEGATIVE
Comment: NEGATIVE
Comment: NORMAL
Neisseria Gonorrhea: NEGATIVE

## 2019-11-05 LAB — T4, FREE: Free T4: 0.85 ng/dL (ref 0.82–1.77)

## 2019-11-05 LAB — TSH: TSH: 2.11 u[IU]/mL (ref 0.450–4.500)

## 2019-11-05 LAB — HEPATITIS C ANTIBODY: Hep C Virus Ab: <0.1 s/co ratio (ref 0.0–0.9)

## 2019-11-06 LAB — CYTOLOGY - PAP
Comment: NEGATIVE
Diagnosis: NEGATIVE
High risk HPV: NEGATIVE

## 2019-11-06 LAB — URINE CULTURE, OB REFLEX: Organism ID, Bacteria: NO GROWTH

## 2019-11-06 LAB — CULTURE, OB URINE

## 2019-12-02 ENCOUNTER — Encounter: Payer: Self-pay | Admitting: Obstetrics and Gynecology

## 2019-12-02 ENCOUNTER — Ambulatory Visit (INDEPENDENT_AMBULATORY_CARE_PROVIDER_SITE_OTHER): Payer: Self-pay | Admitting: Obstetrics and Gynecology

## 2019-12-02 ENCOUNTER — Other Ambulatory Visit: Payer: Self-pay

## 2019-12-02 ENCOUNTER — Other Ambulatory Visit (HOSPITAL_COMMUNITY)
Admission: RE | Admit: 2019-12-02 | Discharge: 2019-12-02 | Disposition: A | Discharge status: Home / Self Care | Payer: Self-pay | Source: Ambulatory Visit | Attending: Obstetrics and Gynecology | Admitting: Obstetrics and Gynecology

## 2019-12-02 VITALS — BP 108/69 | HR 93 | Wt 130.4 lb

## 2019-12-02 DIAGNOSIS — Z98891 History of uterine scar from previous surgery: Secondary | ICD-10-CM

## 2019-12-02 DIAGNOSIS — O99282 Endocrine, nutritional and metabolic diseases complicating pregnancy, second trimester: Secondary | ICD-10-CM

## 2019-12-02 DIAGNOSIS — O09522 Supervision of elderly multigravida, second trimester: Secondary | ICD-10-CM

## 2019-12-02 DIAGNOSIS — Z3A21 21 weeks gestation of pregnancy: Secondary | ICD-10-CM

## 2019-12-02 DIAGNOSIS — L299 Pruritus, unspecified: Secondary | ICD-10-CM | POA: Insufficient documentation

## 2019-12-02 DIAGNOSIS — E0590 Thyrotoxicosis, unspecified without thyrotoxic crisis or storm: Secondary | ICD-10-CM

## 2019-12-02 DIAGNOSIS — O0992 Supervision of high risk pregnancy, unspecified, second trimester: Secondary | ICD-10-CM

## 2019-12-02 DIAGNOSIS — Z8632 Personal history of gestational diabetes: Secondary | ICD-10-CM

## 2019-12-02 DIAGNOSIS — O0990 Supervision of high risk pregnancy, unspecified, unspecified trimester: Secondary | ICD-10-CM

## 2019-12-02 DIAGNOSIS — O34219 Maternal care for unspecified type scar from previous cesarean delivery: Secondary | ICD-10-CM

## 2019-12-02 NOTE — Progress Notes (Signed)
Subjective:  Janice Bernard is a 39 y.o. G4P1021 at [redacted]w[redacted]d being seen today for ongoing prenatal care.  She is currently monitored for the following issues for this high-risk pregnancy and has Supervision of high risk pregnancy, antepartum; AMA (advanced maternal age) multigravida 6+; History of gestational diabetes mellitus (GDM); Fibroid; Hyperthyroidism; and History of cesarean section on their problem list.  Patient reports carpal tunnel symptoms.  Contractions: Not present. Vag. Bleeding: None.  Movement: Present. Denies leaking of fluid.   The following portions of the patient's history were reviewed and updated as appropriate: allergies, current medications, past family history, past medical history, past social history, past surgical history and problem list. Problem list updated.  Objective:   Vitals:   12/02/19 0916  BP: 108/69  Pulse: 93  Weight: 130 lb 6.4 oz (59.1 kg)    Fetal Status: Fetal Heart Rate (bpm): 147   Movement: Present     General:  Alert, oriented and cooperative. Patient is in no acute distress.  Skin: Skin is warm and dry. No rash noted.   Cardiovascular: Normal heart rate noted  Respiratory: Normal respiratory effort, no problems with respiration noted  Abdomen: Soft, gravid, appropriate for gestational age. Pain/Pressure: Absent     Pelvic:  Cervical exam deferred        Extremities: Normal range of motion.  Edema: None  Mental Status: Normal mood and affect. Normal behavior. Normal judgment and thought content.   Urinalysis:      Assessment and Plan:  Pregnancy: [id number] at [redacted]w[redacted]d Stable Glucola next visit  Hyperthyroidism Nl thyroid function, no meds, follow thyroid function q trimester  H/O GDM Nl Hgb A1c, glucola next visit  AMA Decline genetic studies  H/O C section, ? Low vertical OP note requested   Live interrupter used during today's visit    Preterm labor symptoms and general obstetric precautions including but not limited  to vaginal bleeding, contractions, leaking of fluid and fetal movement were reviewed in detail with the patient. Please refer to After Visit Summary for other counseling recommendations.  Return in about 4 weeks (around 12/30/2019) for face to face, MD only, OB visit, glucola appt.   Chancy [place], MD

## 2019-12-02 NOTE — Addendum Note (Signed)
Addended by: Bethanne Ginger on: 12/02/2019 09:48 AM   Modules accepted: Orders

## 2019-12-02 NOTE — Patient Instructions (Addendum)
Segundo trimestre de embarazo Second Trimester of Pregnancy  El segundo trimestre va desde la semana14 hasta la 27 (desde el mes 4 hasta el 6). Este suele ser el momento en el que mejor se siente. En general, las nuseas matutinas han disminuido o han desaparecido completamente. Tendr ms energa y podr aumentarle el apetito. El beb en gestacin se desarrolla rpidamente. Hacia el final del sexto mes, el beb mide aproximadamente 9 pulgadas (23 cm) y pesa alrededor de 1 libras (700 g). Es probable que sienta al beb moverse entre las 18 y 20 semanas del embarazo. Siga estas indicaciones en su casa: Medicamentos  Tome los medicamentos de venta libre y los recetados solamente como se lo haya indicado el mdico. Algunos medicamentos son seguros para tomar durante el embarazo y otros no lo son.  Tome vitaminas prenatales que contengan por lo menos 600microgramos (?g) de cido flico.  Si tiene dificultad para mover el intestino (estreimiento), tome un medicamento para ablandar las heces (laxante) si su mdico se lo autoriza. Comida y bebida   Ingiera alimentos saludables de manera regular.  No coma carne cruda ni quesos sin cocinar.  Si obtiene poca cantidad de calcio de los alimentos que ingiere, consulte a su mdico sobre la posibilidad de tomar un suplemento diario de calcio.  Evite el consumo de alimentos ricos en grasas y azcares, como los alimentos fritos y los dulces.  Si tiene malestar estomacal (nuseas) o devuelve (vomita): ? Ingiera 4 o 5comidas pequeas por da en lugar de 3abundantes. ? Intente comer algunas galletitas saladas. ? Beba lquidos entre las comidas, en lugar de hacerlo durante estas.  Para evitar el estreimiento: ? Consuma alimentos ricos en fibra, como frutas y verduras frescas, cereales integrales y frijoles. ? Beba suficiente lquido para mantener el pis (orina) claro o de color amarillo plido. Actividad  Haga ejercicios solamente como se lo haya  indicado el mdico. Interrumpa la actividad fsica si comienza a tener calambres.  No haga ejercicio si hace demasiado calor, hay demasiada humedad o se encuentra en un lugar de mucha altura (altitud alta).  Evite levantar pesos excesivos.  Use zapatos con tacones bajos. Mantenga una buena postura al sentarse y pararse.  Puede continuar teniendo relaciones sexuales, a menos que el mdico le indique lo contrario. Alivio del dolor y del malestar  Use un sostn que le brinde buen soporte si sus mamas estn sensibles.  Dese baos de asiento con agua tibia para aliviar el dolor o las molestias causadas por las hemorroides. Use una crema para las hemorroides si el mdico la autoriza.  Descanse con las piernas elevadas si tiene calambres o dolor de cintura.  Si desarrolla venas hinchadas y abultadas (vrices) en las piernas: ? Use medias de compresin o medias de descanso como se lo haya indicado el mdico. ? Levante (eleve) los pies durante 15minutos, 3 o 4veces por da. ? Limite el consumo de sal en sus alimentos. Cuidado prenatal  Escriba sus preguntas. Llvelas cuando concurra a las visitas prenatales.  Concurra a todas las visitas prenatales como se lo haya indicado el mdico. Esto es importante. Seguridad  Colquese el cinturn de seguridad cuando conduzca.  Haga una lista de los nmeros de telfono de emergencia, que incluya los nmeros de telfono de familiares, amigos, el hospital, as como los departamentos de polica y bomberos. Instrucciones generales  Consulte a su mdico sobre los alimentos que debe comer o pdale que la ayude a encontrar a quien pueda aconsejarla si necesita ese servicio.    Consulte a su mdico acerca de dnde se dictan clases prenatales cerca de donde vive. Comience las clases antes del mes 6 de embarazo.  No se d baos de inmersin en agua caliente, baos turcos ni saunas.  No se haga duchas vaginales ni use tampones o toallas higinicas perfumadas.   No mantenga las piernas cruzadas durante mucho tiempo.  Vaya al dentista si an no lo hizo. Use un cepillo de cerdas suaves para cepillarse los dientes. Psese el hilo dental suavemente.  No fume, no consuma hierbas ni beba alcohol. No tome frmacos que el mdico no haya autorizado.  No consuma ningn producto que contenga nicotina o tabaco, como cigarrillos y cigarrillos electrnicos. Si necesita ayuda para dejar de fumar, consulte al mdico.  Evite el contacto con las bandejas sanitarias de los gatos y la tierra que estos animales usan. Estos elementos contienen bacterias que pueden causar defectos congnitos al beb y la posible prdida del beb (aborto espontneo) o la muerte fetal. Comunquese con un mdico si:  Tiene clicos leves o siente presin en la parte baja del vientre.  Tiene dolor al hacer pis (orinar).  Advierte un lquido con olor ftido que proviene de la vagina.  Tiene malestar estomacal (nuseas), devuelve (vomita) o tiene deposiciones acuosas (diarrea).  Sufre un dolor persistente en el abdomen.  Siente mareos. Solicite ayuda de inmediato si:  Tiene fiebre.  Tiene una prdida de lquido por la vagina.  Tiene sangrado o pequeas prdidas vaginales.  Siente dolor intenso o clicos en el abdomen.  Sube o baja de peso rpidamente.  Tiene dificultades para recuperar el aliento y siente dolor en el pecho.  Sbitamente se le hinchan mucho el rostro, las manos, los tobillos, los pies o las piernas.  No ha sentido los movimientos del beb durante una hora.  Siente un dolor de cabeza intenso que no se alivia al tomar medicamentos.  Tiene dificultad para ver. Resumen  El segundo trimestre va desde la semana14 hasta la 27, desde el mes 4 hasta el 6. Este suele ser el momento en el que mejor se siente.  Para cuidarse y cuidar a su beb en gestacin, debe comer alimentos saludables, tomar medicamentos solamente si su mdico le indica que lo haga y hacer  actividades que sean seguras para usted y su beb.  Llame al mdico si se enferma o si nota algo inusual acerca de su embarazo. Tambin llame al mdico si necesita ayuda para saber qu alimentos debe comer o si quiere saber qu actividades puede realizar de forma segura. Esta informacin no tiene como fin reemplazar el consejo del mdico. Asegrese de hacerle al mdico cualquier pregunta que tenga. Document Revised: 03/06/2017 Document Reviewed: 03/06/2017 Elsevier Patient Education  2020 Elsevier Inc.  

## 2019-12-02 NOTE — Progress Notes (Signed)
Pt states having yellow d/c with itching, no odor.

## 2019-12-03 LAB — CERVICOVAGINAL ANCILLARY ONLY
Bacterial Vaginitis (gardnerella): NEGATIVE
Candida Glabrata: NEGATIVE
Candida Vaginitis: POSITIVE — AB
Chlamydia: NEGATIVE
Comment: NEGATIVE
Comment: NEGATIVE
Comment: NEGATIVE
Comment: NEGATIVE
Comment: NEGATIVE
Comment: NORMAL
Neisseria Gonorrhea: NEGATIVE
Trichomonas: NEGATIVE

## 2019-12-04 ENCOUNTER — Telehealth: Payer: Self-pay | Admitting: Lactation Services

## 2019-12-04 MED ORDER — TERCONAZOLE 0.4 % VA CREA: 1.0000 | TOPICAL_CREAM | Freq: Every day | VAGINAL | 0 refills | Status: DC

## 2019-12-04 NOTE — Telephone Encounter (Signed)
Called patient with assistance of Telephone [employer], Randall Hiss # [phone]. Patient did not answer and voice mailbox not set up. Will need to try again later. Terazol prescription sent to Pharmacy.

## 2019-12-07 ENCOUNTER — Telehealth: Payer: Self-pay | Admitting: Lactation Services

## 2019-12-07 NOTE — Telephone Encounter (Signed)
Attempted to call patient with assistance of Telephone [address] Marton [place] # [phone]. Patient did not answer and mailbox not set up to be able to leave a message. Will try again at a later time.

## 2019-12-07 NOTE — Telephone Encounter (Signed)
-----   Message from Chancy Milroy, MD sent at 12/04/2019 12:45 PM EDT ----- Please send in Rx for Terazol 1 applicator qhs x 7 days for yeast Thanks Legrand Como

## 2019-12-08 NOTE — Telephone Encounter (Signed)
Notified pt with [employer] Interpreter # [phone] and notified pt that she has a yeast infection.  We have also sent in an Rx for Terazol cream to her [facility] [address] that she will use daily for seven days at bedtime.  I informed pt that we will see her on 12/30/19 for 2 hr and provider appt to make sure that has nothing to drink or eat starting midnight the night before.  Pt verbalized understanding.    Mel Almond, RN  12/08/19

## 2019-12-24 ENCOUNTER — Telehealth: Payer: Self-pay

## 2019-12-24 ENCOUNTER — Encounter: Payer: Self-pay | Admitting: General Practice

## 2019-12-24 NOTE — Telephone Encounter (Addendum)
-----   Message from Clarisa Fling, NP sent at 12/24/2019  3:48 PM EDT ----- Regarding: Incomplete Anatomy Hello - this patient had an incomplete anatomy scan. Please help her get scheduled for a f/u ASAP with [facility] as patient is [facility]. Thank you, Elmyra Ricks ----- Message ----- From: Shelly Coss, RN Sent: 12/24/2019   1:27 PM EDT To: Clarisa Fling, NP   Scheduled for July 12th @ 1230 at [facility] for f/u anatomy U/S.  Notified pt with spanish interpreter Eda R., appt time and location.  Pt verbalized understanding.   Mel Almond, RN  12/24/19

## 2019-12-30 ENCOUNTER — Ambulatory Visit (INDEPENDENT_AMBULATORY_CARE_PROVIDER_SITE_OTHER): Payer: Self-pay | Admitting: Obstetrics & Gynecology

## 2019-12-30 ENCOUNTER — Other Ambulatory Visit: Payer: Self-pay

## 2019-12-30 VITALS — BP 107/63 | HR 88 | Wt 134.2 lb

## 2019-12-30 DIAGNOSIS — O09522 Supervision of elderly multigravida, second trimester: Secondary | ICD-10-CM

## 2019-12-30 DIAGNOSIS — Z8632 Personal history of gestational diabetes: Secondary | ICD-10-CM

## 2019-12-30 DIAGNOSIS — Z98891 History of uterine scar from previous surgery: Secondary | ICD-10-CM

## 2019-12-30 DIAGNOSIS — O0990 Supervision of high risk pregnancy, unspecified, unspecified trimester: Secondary | ICD-10-CM

## 2019-12-30 DIAGNOSIS — Z3A25 25 weeks gestation of pregnancy: Secondary | ICD-10-CM

## 2019-12-30 DIAGNOSIS — O0992 Supervision of high risk pregnancy, unspecified, second trimester: Secondary | ICD-10-CM

## 2019-12-30 NOTE — Patient Instructions (Signed)
Parto vaginal despus de un parto por cesrea Vaginal Birth After Cesarean Delivery  Un parto vaginal despus de un parto por cesrea (PVDC) es dar a [place] por la vagina despus de haber dado a [place] por medio de una intervencin quirrgica llamada cesrea. Es posible que el PVDC sea una alternativa segura para usted, segn su salud y Quarry manager. Es importante que hable con su mdico acerca del PVDC desde comienzos del Media planner de modo que pueda comprender los riesgos, beneficios y opciones. Hablar sobre estos temas desde el comienzo le permitir tener tiempo para Development worker, international aid. Quines son las mejores candidatas para Raynelle Jan un PVDC? Las mejores candidatas para Best boy un PVDC son las mujeres que cumplen con los siguientes requisitos:  Tuvieron uno o dos partos por Dispensing optician, y las incisiones realizadas durante los partos fueron horizontales (transversales bajas).  No tienen una cicatriz uterina vertical (clsica).  No han tenido una ruptura en la pared del tero (ruptura uterina).  Piensan tener otros embarazos. Es ms probable que un PVDC se realice correctamente en los siguientes casos:  Mujeres que ya tuvieron partos vaginales antes.  Cuando el trabajo de parto comienza sin ninguna intervencin (de forma espontnea) antes de la fecha prevista. Cules son los beneficios del PVDC? Algunos de los beneficios de que el beb nazca por parto vaginal en lugar de por cesrea:  Estada ms corta en el hospital.  Menor tiempo de recuperacin.  Menos dolor.  Evitar los [employer] a las cirugas mayores, como las infecciones y los cogulos de [place].  Menor prdida de [place] y Garment/textile technologist probabilidad de Designer, industrial/product sangre de un donante (transfusin). Cules son los riesgos del PVDC? El principal riesgo de Surveyor, minerals un PVDC es que existe una posibilidad de que [place], lo que obligar al mdico a Optometrist una cesrea. Los otros riesgos son muy poco frecuentes y pueden ser  los siguientes:  Desgarro (ruptura) de la cicatriz de un parto por cesrea anterior.  Otros riesgos asociados con los [employer]. Si se debe repetir un parto por cesrea, los riesgos [employer] siguientes:  Prdida de [place].  Infeccin.  Cogulos de Sp[place].  Lesin en los rganos circundantes.  Extirpacin del tero (histerectoma), si se daa.  Problemas de placenta en embarazos futuros. Qu ms debo [place] alternativas? El parto vaginal despus de Elmon Else es similar a un parto espontneo vaginal normal. Por lo tanto, es seguro:  Intentarlo cuando se trata de gemelos.  Que el mdico intente girar al beb si se encuentra de nalgas (versin ceflica externa) durante el trabajo de [place].  Colocar anestesia epidural para Best boy. Considerar posibilidades sobre dnde le gustara que ocurriera el [place]. El PVDC debe llevarse a cabo en centros donde se pueda realizar un parto por cesrea de urgencia. No se recomiendan los partos domiciliarios si planea tener un PVDC. Cualquier cambio en su salud o la de su beb durante el embarazo puede ser motivo de un cambio de decisin respecto del parto vaginal. El mdico podra recomendarle no realizar un PVDC en los siguientes casos:  El beb parece pesar 8.8lb (4kg) o ms.  Tiene preeclampsia. Esta es una afeccin que provoca el aumento de la presin arterial y otros sntomas, como hinchazn y dolores de [place].  Tendr un PVDC menos de 60meses despus de un parto por cesrea.  Ya pas la fecha prevista del [place].  Necesita que se inicie el trabajo de parto (induccin) porque el cuello uterino no est listo para  el parto (desfavorable). Dnde encontrar ms informacin  Asociacin Americana del Embarazo (American Pregnancy Association): americanpregnancy.org.  Congreso Estadounidense de Obstetras y Gineclogos (American College of Obstetricians and Gynecologists): acog.org Resumen  Un parto vaginal  despus de un parto por cesrea (PVDC) es dar a [place] por la vagina despus de haber dado a Jacyn[place] por medio de una intervencin quirrgica llamada cesrea. Es posible que el PVDC sea una alternativa segura para usted, segn su salud y otros factores.  Hable con el mdico desde comienzos del embarazo para que pueda comprender los riesgos, los beneficios y las alternativas, y tener mucho tiempo para planificar el parto.  El principal riesgo de intentar tener un PVDC es que existe una posibilidad de que falle, lo que obligar al mdico a realizar una cesrea. Los otros riesgos son muy poco frecuentes. Esta informacin no tiene como fin reemplazar el consejo del mdico. Asegrese de hacerle al mdico cualquier pregunta que tenga. Document Revised: 01/15/2017 Document Reviewed: 01/15/2017 Elsevier Patient Education  2020 Elsevier Inc.  

## 2019-12-30 NOTE — Progress Notes (Signed)
   PRENATAL VISIT NOTE  Subjective:  Janice Bernard is a 39 y.o. G4P1021 at [redacted]w[redacted]d being seen today for ongoing prenatal care.  She is currently monitored for the following issues for this high-risk pregnancy and has Supervision of high risk pregnancy, antepartum; AMA (advanced maternal age) multigravida 63+; History of gestational diabetes mellitus (GDM); Fibroid; Hyperthyroidism; and History of cesarean section on their problem list.  Patient reports feet swelling and bilateral CTS.  Contractions: Not present. Vag. Bleeding: None.  Movement: Present. Denies leaking of fluid.   The following portions of the patient's history were reviewed and updated as appropriate: allergies, current medications, past family history, past medical history, past social history, past surgical history and problem list.   Objective:   Vitals:   12/30/19 0953  BP: 107/63  Pulse: 88  Weight: 134 lb 3.2 oz (60.9 kg)    Fetal Status: Fetal Heart Rate (bpm): 159   Movement: Present     General:  Alert, oriented and cooperative. Patient is in no acute distress.  Skin: Skin is warm and dry. No rash noted.   Cardiovascular: Normal heart rate noted  Respiratory: Normal respiratory effort, no problems with respiration noted  Abdomen: Soft, gravid, appropriate for gestational age.  Pain/Pressure: Present     Pelvic: Cervical exam deferred        Extremities: Normal range of motion.  Edema: Trace  Mental Status: Normal mood and affect. Normal behavior. Normal judgment and thought content.   Assessment and Plan:  Pregnancy: G4P1021 at [redacted]w[redacted]d 1. Supervision of high risk pregnancy, antepartum CTS bilateral - Wrist splint  2. Multigravida of advanced maternal age in second trimester Screen for DM  3. History of gestational diabetes mellitus (GDM)   Preterm labor symptoms and general obstetric precautions including but not limited to vaginal bleeding, contractions, leaking of fluid and fetal movement were  reviewed in detail with the patient. Please refer to After Visit Summary for other counseling recommendations.   Return in about 4 weeks (around 01/27/2020).  No future appointments.  Emeterio Reeve, MD

## 2019-12-30 NOTE — Addendum Note (Signed)
Addended by: Annabell Howells on: 12/30/2019 08:34 AM   Modules accepted: Orders

## 2019-12-31 LAB — CBC
Hematocrit: 32.8 % — ABNORMAL LOW (ref 34.0–46.6)
Hemoglobin: 11.6 g/dL (ref 11.1–15.9)
MCH: 31 pg (ref 26.6–33.0)
MCHC: 35.4 g/dL (ref 31.5–35.7)
MCV: 88 fL (ref 79–97)
Platelets: 183 10*3/uL (ref 150–450)
RBC: 3.74 x10E6/uL — ABNORMAL LOW (ref 3.77–5.28)
RDW: 13.7 % (ref 11.7–15.4)
WBC: 9.2 10*3/uL (ref 3.4–10.8)

## 2019-12-31 LAB — RPR: RPR Ser Ql: NONREACTIVE

## 2019-12-31 LAB — GLUCOSE TOLERANCE, 2 HOURS W/ 1HR
Glucose, 1 hour: 163 mg/dL (ref 65–179)
Glucose, 2 hour: 133 mg/dL (ref 65–152)
Glucose, Fasting: 75 mg/dL (ref 65–91)

## 2019-12-31 LAB — HIV ANTIBODY (ROUTINE TESTING W REFLEX): HIV Screen 4th Generation wRfx: NONREACTIVE

## 2020-01-05 ENCOUNTER — Other Ambulatory Visit: Payer: Self-pay

## 2020-01-05 ENCOUNTER — Other Ambulatory Visit: Payer: Self-pay | Admitting: Student

## 2020-01-05 DIAGNOSIS — R21 Rash and other nonspecific skin eruption: Secondary | ICD-10-CM

## 2020-01-05 NOTE — Progress Notes (Signed)
Called by [name] as patient had walked up requesting appointment due to itching on back of hands and "all over body rash".  Brief exam shows mild flaking on hands and wrists, no papules, vesicules. Will draw bile acids this afternoon, otherwise, patient may try OTC methods otherwise.  Janice Bernard

## 2020-01-06 LAB — BILE ACIDS, TOTAL: Bile Acids Total: 3.9 umol/L (ref 0.0–10.0)

## 2020-01-21 ENCOUNTER — Encounter: Payer: Self-pay | Admitting: Obstetrics & Gynecology

## 2020-01-27 ENCOUNTER — Other Ambulatory Visit: Payer: Self-pay

## 2020-01-27 ENCOUNTER — Encounter: Payer: Self-pay | Admitting: Family Medicine

## 2020-01-27 ENCOUNTER — Encounter: Payer: Self-pay | Admitting: Obstetrics & Gynecology

## 2020-01-27 ENCOUNTER — Ambulatory Visit (INDEPENDENT_AMBULATORY_CARE_PROVIDER_SITE_OTHER): Payer: Self-pay | Admitting: Obstetrics & Gynecology

## 2020-01-27 VITALS — BP 114/66 | HR 107 | Wt 137.8 lb

## 2020-01-27 DIAGNOSIS — O09522 Supervision of elderly multigravida, second trimester: Secondary | ICD-10-CM

## 2020-01-27 DIAGNOSIS — Z23 Encounter for immunization: Secondary | ICD-10-CM

## 2020-01-27 DIAGNOSIS — Z98891 History of uterine scar from previous surgery: Secondary | ICD-10-CM

## 2020-01-27 DIAGNOSIS — O0990 Supervision of high risk pregnancy, unspecified, unspecified trimester: Secondary | ICD-10-CM

## 2020-01-27 NOTE — Patient Instructions (Addendum)
Parto vaginal despus de un parto por cesrea Vaginal Birth After Cesarean Delivery  Un parto vaginal despus de un parto por cesrea (PVDC) es dar a Janice Bernard por la vagina despus de haber dado a Janice Bernard por medio de una intervencin quirrgica llamada cesrea. Es posible que el PVDC sea una alternativa segura para usted, segn su salud y Janice Bernard. Es importante que hable con su mdico acerca del PVDC desde comienzos del Media planner de modo que pueda comprender los riesgos, beneficios y opciones. Hablar sobre estos temas desde el comienzo le permitir tener tiempo para Development worker, international aid. Quines son las mejores candidatas para Janice Bernard un PVDC? Las mejores candidatas para Janice Bernard un PVDC son las mujeres que cumplen con los siguientes requisitos:  Tuvieron uno o dos partos por Dispensing optician, y las incisiones realizadas durante los partos fueron horizontales (transversales bajas).  No tienen una cicatriz uterina vertical (clsica).  No han tenido una ruptura en la pared del tero (ruptura uterina).  Piensan tener otros embarazos. Es ms probable que un PVDC se realice correctamente en los siguientes casos:  Mujeres que ya tuvieron partos vaginales antes.  Cuando el trabajo de parto comienza sin ninguna intervencin (de forma espontnea) antes de la fecha prevista. Cules son los beneficios del PVDC? Algunos de los beneficios de que el beb nazca por parto vaginal en lugar de por cesrea:  Estada ms corta en el hospital.  Menor tiempo de recuperacin.  Menos dolor.  Evitar los [employer] a las cirugas mayores, como las infecciones y los cogulos de [place].  Menor prdida de [place] y Garment/textile technologist probabilidad de Designer, industrial/product sangre de un donante (transfusin). Cules son los riesgos del PVDC? El principal riesgo de Surveyor, minerals un PVDC es que existe una posibilidad de que Janice Bernard, lo que obligar al mdico a Optometrist una cesrea. Los otros riesgos son muy poco frecuentes y pueden ser  los siguientes:  Desgarro (ruptura) de la cicatriz de un parto por cesrea anterior.  Otros riesgos asociados con los [employer]. Si se debe repetir un parto por cesrea, los riesgos [employer] siguientes:  Prdida de [place].  Infeccin.  Cogulos de [place].  Lesin en los rganos circundantes.  Extirpacin del tero (histerectoma), si se daa.  Problemas de placenta en embarazos futuros. Qu ms debo [place] alternativas? El parto vaginal despus de Elmon Else es similar a un parto espontneo vaginal normal. Por lo tanto, es seguro:  Intentarlo cuando se trata de gemelos.  Que el mdico intente girar al beb si se encuentra de nalgas (versin ceflica externa) durante el trabajo de [place].  Colocar anestesia epidural para Janice Bernard. Considerar posibilidades sobre dnde le gustara que ocurriera el [place]. El PVDC debe llevarse a cabo en centros donde se pueda realizar un parto por cesrea de urgencia. No se recomiendan los partos domiciliarios si planea tener un PVDC. Cualquier cambio en su salud o la de su beb durante el embarazo puede ser motivo de un cambio de decisin respecto del parto vaginal. El mdico podra recomendarle no realizar un PVDC en los siguientes casos:  El beb parece pesar 8.8lb (4kg) o ms.  Tiene preeclampsia. Esta es una afeccin que provoca el aumento de la presin arterial y otros sntomas, como hinchazn y dolores de Janice Bernard.  Tendr un PVDC menos de 60meses despus de un parto por cesrea.  Ya pas la fecha prevista del Janice Bernard.  Necesita que se inicie el trabajo de parto (induccin) porque el cuello uterino no est listo para  el parto (desfavorable). Dnde encontrar ms informacin  Asociacin Americana del Embarazo (American Pregnancy Association): americanpregnancy.org.  Congreso Estadounidense de Obstetras y Gineclogos (American College of Obstetricians and Gynecologists): acog.org Resumen  Un parto vaginal  despus de un parto por cesrea (PVDC) es dar a Janice Bernard por la vagina despus de haber dado a Janice Bernard por medio de una intervencin quirrgica llamada cesrea. Es posible que el PVDC sea una alternativa segura para usted, segn su salud y otros factores.  Hable con el mdico desde comienzos del embarazo para que pueda comprender los riesgos, los beneficios y las alternativas, y tener mucho tiempo para planificar el parto.  El principal riesgo de intentar tener un PVDC es que existe una posibilidad de que falle, lo que obligar al mdico a realizar una cesrea. Los otros riesgos son muy poco frecuentes. Esta informacin no tiene como fin reemplazar el consejo del mdico. Asegrese de hacerle al mdico cualquier pregunta que tenga. Document Revised: 01/15/2017 Document Reviewed: 01/15/2017 Elsevier Patient Education  2020 Elsevier Inc.  

## 2020-01-27 NOTE — Addendum Note (Signed)
Addended by: Louisa Second E on: 01/27/2020 12:19 PM   Modules accepted: Orders

## 2020-01-27 NOTE — Progress Notes (Signed)
PHQ-9 and GAD-7 negative; denies SI. Pt reports feelings of depression. I offered [facility] services, pt is concerned about cost. I explained that there is no cost to speak with [facility] for less than 15 minutes. Pt states she would prefer to follow up with [facility] resources. Pt given crisis phone number and encouraged to follow up with [facility] regarding counseling services.  Apolonio Schneiders RN 01/27/20

## 2020-01-27 NOTE — Progress Notes (Signed)
   PRENATAL VISIT NOTE  Subjective:  Janice Bernard is a 39 y.o. G4P1021 at [redacted]w[redacted]d being seen today for ongoing prenatal care.  She is currently monitored for the following issues for this high-risk pregnancy and has Supervision of high risk pregnancy, antepartum; AMA (advanced maternal age) multigravida 43+; History of gestational diabetes mellitus (GDM); Fibroid; Hyperthyroidism; and History of cesarean section on their problem list.  Patient reports no complaints.  Contractions: Irritability. Vag. Bleeding: None.  Movement: Present. Denies leaking of fluid.   The following portions of the patient's history were reviewed and updated as appropriate: allergies, current medications, past family history, past medical history, past social history, past surgical history and problem list.   Objective:   Vitals:   01/27/20 1045  BP: 114/66  Pulse: (!) 107  Weight: 137 lb 12.8 oz (62.5 kg)    Fetal Status: Fetal Heart Rate (bpm): 145 Fundal Height: 30 cm Movement: Present     General:  Alert, oriented and cooperative. Patient is in no acute distress.  Skin: Skin is warm and dry. No rash noted.   Cardiovascular: Normal heart rate noted  Respiratory: Normal respiratory effort, no problems with respiration noted  Abdomen: Soft, gravid, appropriate for gestational age.  Pain/Pressure: Present     Pelvic: Cervical exam deferred        Extremities: Normal range of motion.  Edema: Trace  Mental Status: Normal mood and affect. Normal behavior. Normal judgment and thought content.   Assessment and Plan:  Pregnancy: G4P1021 at [redacted]w[redacted]d 1. Supervision of high risk pregnancy, antepartum   2. Multigravida of advanced maternal age in second trimester   3. History of cesarean section TOLAC consent today  Preterm labor symptoms and general obstetric precautions including but not limited to vaginal bleeding, contractions, leaking of fluid and fetal movement were reviewed in detail with the  patient. Please refer to After Visit Summary for other counseling recommendations.   Return in about 2 weeks (around 02/10/2020) for needs pregnancy note for immigration.  No future appointments.  Emeterio Reeve, MD

## 2020-02-01 ENCOUNTER — Other Ambulatory Visit: Payer: Self-pay

## 2020-02-01 ENCOUNTER — Inpatient Hospital Stay (HOSPITAL_COMMUNITY)
Admission: AD | Admit: 2020-02-01 | Discharge: 2020-02-01 | Disposition: A | Discharge status: Home / Self Care | Payer: Self-pay | Attending: Obstetrics & Gynecology | Admitting: Obstetrics & Gynecology

## 2020-02-01 ENCOUNTER — Encounter (HOSPITAL_COMMUNITY): Payer: Self-pay | Admitting: Obstetrics & Gynecology

## 2020-02-01 DIAGNOSIS — Z8759 Personal history of other complications of pregnancy, childbirth and the puerperium: Secondary | ICD-10-CM | POA: Insufficient documentation

## 2020-02-01 DIAGNOSIS — Z3A30 30 weeks gestation of pregnancy: Secondary | ICD-10-CM | POA: Insufficient documentation

## 2020-02-01 DIAGNOSIS — O09293 Supervision of pregnancy with other poor reproductive or obstetric history, third trimester: Secondary | ICD-10-CM | POA: Insufficient documentation

## 2020-02-01 DIAGNOSIS — Z79899 Other long term (current) drug therapy: Secondary | ICD-10-CM | POA: Insufficient documentation

## 2020-02-01 DIAGNOSIS — O09523 Supervision of elderly multigravida, third trimester: Secondary | ICD-10-CM | POA: Insufficient documentation

## 2020-02-01 DIAGNOSIS — O4703 False labor before 37 completed weeks of gestation, third trimester: Secondary | ICD-10-CM | POA: Insufficient documentation

## 2020-02-01 DIAGNOSIS — O26893 Other specified pregnancy related conditions, third trimester: Secondary | ICD-10-CM | POA: Insufficient documentation

## 2020-02-01 DIAGNOSIS — R109 Unspecified abdominal pain: Secondary | ICD-10-CM | POA: Insufficient documentation

## 2020-02-01 LAB — URINALYSIS, ROUTINE W REFLEX MICROSCOPIC
Bilirubin Urine: NEGATIVE
Glucose, UA: NEGATIVE mg/dL
Hgb urine dipstick: NEGATIVE
Ketones, ur: NEGATIVE mg/dL
Leukocytes,Ua: NEGATIVE
Nitrite: NEGATIVE
Protein, ur: NEGATIVE mg/dL
Specific Gravity, Urine: 1.006 (ref 1.005–1.030)
pH: 7 (ref 5.0–8.0)

## 2020-02-01 LAB — WET PREP, GENITAL
Sperm: NONE SEEN
Trich, Wet Prep: NONE SEEN
Yeast Wet Prep HPF POC: NONE SEEN

## 2020-02-01 MED ORDER — ACETAMINOPHEN 325 MG PO TABS: 650.0000 mg | ORAL_TABLET | Freq: Once | ORAL | Status: DC

## 2020-02-01 NOTE — MAU Provider Note (Signed)
Chief Complaint:  Contractions  HPI: Janice Bernard is a 39 y.o. G4P1021 at [redacted]w[redacted]d by L/6 who presents to maternity admissions reporting contractions. Pt reports contractions starting on 8/7 and becoming stronger over the past several days. Pain with contractions rated 3-4/10 and occurring every 15-20 minutes. She also endorses pelvic pressure but able to walk and talk through contractions without difficulty. She reports good fetal movement, denies LOF, vaginal bleeding, vaginal itching/burning, urinary symptoms, h/a, dizziness, n/v, or fever/chills. Next prenatal appt in 2 weeks. Plan for TOLAC s/p CS x1.  HPI  Past Medical History: Past Medical History:  Diagnosis Date  . Depressed   . Fibroid    noted on [place] 07/2019  . Hyperthyroidism     Past obstetric history: OB History  Gravida Para Term Preterm AB Living  4 1 1  0 2 1  SAB TAB Ectopic Multiple Live Births  2 0 0 0 1    # Outcome Date GA Lbr Len/2nd Weight Sex Delivery Anes PTL Lv  4 Current           3 SAB 06/2011          2 SAB 02/2011 [redacted]w[redacted]d            Birth Comments: D&E for pregnancy loss at approximately [redacted] wks EGA  1 Term 01/06/05 [redacted]w[redacted]d  2920 g M CS-[id number]   LIV     Birth Comments: Fetal intolerance of labor, 5 cm     Complications: Failure to Progress in First Stage    Past Surgical History: Past Surgical History:  Procedure Laterality Date  . CESAREAN SECTION    . DILATION AND EVACUATION  02/06/2011   Procedure: DILATATION AND EVACUATION (D&E);  Surgeon: Emeterio Reeve, MD;  Location: [facility];  Service: Gynecology;  Laterality: N/A;  . DILATION AND EVACUATION  02/22/2011   Procedure: DILATATION AND EVACUATION (D&E);  Surgeon: Emeterio Reeve, MD;  Location: [facility];  Service: Gynecology;  Laterality: N/A;    Family History: Family History  Problem Relation Age of Onset  . Diabetes Paternal Uncle   . Cancer Father   . Throat cancer Father   . Anesthesia problems Neg Hx   . Other Neg Hx     Social  History: Social History   Tobacco Use  . Smoking status: Never Smoker  . Smokeless tobacco: Never Used  Vaping Use  . Vaping Use: Never used  Substance Use Topics  . Alcohol use: Not Currently    Alcohol/week: 3.0 standard drinks    Types: 3 Cans of beer per week    Comment: Social  . Drug use: No    Allergies:  Allergies  Allergen Reactions  . Dilaudid [Hydromorphone Hcl] Itching and Rash    Pt states she is allergic to an IV pain medication but unable to recall name of drug  . Fentanyl Itching and Rash    Pt states she is allergic to an IV pain medication but unable to recall name of drug  . Morphine And Related Itching and Rash    Pt states she is allergic to an IV pain medication but unable to recall name of drug    Meds:  Medications Prior to Admission  Medication Sig Dispense Refill Last Dose  . Prenatal Vit-Fe Fumarate-FA (PRENATAL VITAMINS PO) Take 1 tablet by mouth daily.   Past Week at Unknown time  . Cetirizine HCl (ZYRTEC ALLERGY PO) Take 1 tablet by mouth daily.     . Omega-3 Fatty Acids (FISH  OIL) 1000 MG CAPS Take by mouth.       ROS:  Review of Systems  Constitutional: Negative for activity change, appetite change, chills and fever.  HENT: Negative for congestion and sore throat.   Eyes: Negative for photophobia and visual disturbance.  Respiratory: Negative for cough, chest tightness and shortness of breath.   Cardiovascular: Negative for chest pain.  Gastrointestinal: Positive for abdominal pain. Negative for abdominal distention, constipation, diarrhea, nausea and vomiting.  Genitourinary: Negative for dysuria, urgency, vaginal bleeding, vaginal discharge and vaginal pain.  Musculoskeletal: Negative for back pain.  Neurological: Negative for dizziness and headaches.   I have reviewed patient's Past Medical Hx, Surgical Hx, Family Hx, Social Hx, medications and allergies.   Physical Exam   Patient Vitals for the past 24 hrs:  BP Temp Temp src  Pulse Resp SpO2 Height Weight  02/01/20 2209 117/72 -- -- 79 -- -- -- --  02/01/20 2032 112/72 98.4 F (36.9 C) Oral 84 18 100 % 5' (1.524 m) 62.7 kg   Constitutional: Well-developed, well-nourished female in no acute distress.  Cardiovascular: normal rate Respiratory: normal effort GI: Abd soft, non-tender, gravid appropriate for gestational age.  MS: Extremities nontender, no edema, normal ROM Neurologic: Alert and oriented x 4.   PELVIC EXAM: Cervix slightly swollen and erythematous, visually closed, without lesion, scant white creamy discharge, vaginal walls and external genitalia normal Bimanual exam: Cervix 0/long/high, firm, anterior, neg CMT, uterus nontender, nonenlarged, adnexa without tenderness, enlargement, or mass Dilation: Closed Exam by:: Dr. Astrid Drafts  FHT:  Baseline 125, moderate variability, accelerations present, no decelerations Contractions: q 2-3 mins   Labs: Results for orders placed or performed during the hospital encounter of 02/01/20 (from the past 24 hour(s))  Urinalysis, Routine w reflex microscopic Urine, Clean Catch     Status: None   Collection Time: 02/01/20  8:47 PM  Result Value Ref Range   Color, Urine YELLOW YELLOW   APPearance CLEAR CLEAR   Specific Gravity, Urine 1.006 1.005 - 1.030   pH 7.0 5.0 - 8.0   Glucose, UA NEGATIVE NEGATIVE mg/dL   Hgb urine dipstick NEGATIVE NEGATIVE   Bilirubin Urine NEGATIVE NEGATIVE   Ketones, ur NEGATIVE NEGATIVE mg/dL   Protein, ur NEGATIVE NEGATIVE mg/dL   Nitrite NEGATIVE NEGATIVE   Leukocytes,Ua NEGATIVE NEGATIVE  Wet prep, genital     Status: Abnormal   Collection Time: 02/01/20  9:29 PM  Result Value Ref Range   Yeast Wet Prep HPF POC NONE SEEN NONE SEEN   Trich, Wet Prep NONE SEEN NONE SEEN   Clue Cells Wet Prep HPF POC PRESENT (A) NONE SEEN   WBC, Wet Prep HPF POC MODERATE (A) NONE SEEN   Sperm NONE SEEN    O/Positive/-- (05/12 1008)  Imaging:  No results found.  MAU Course/MDM: Orders  Placed This Encounter  Procedures  . Wet prep, genital  . Urinalysis, Routine w reflex microscopic Urine, Clean Catch  . Discharge patient    Meds ordered this encounter  Medications  . DISCONTD: acetaminophen (TYLENOL) tablet 650 mg    NST reviewed--reactive as noted above Pt discharged with strict vaginal bleeding/preterm labor precautions.  Assessment: 1. False labor before 37 completed weeks of gestation in third trimester: Janice Bernard is a 39 y.o. [id number] at [redacted]w[redacted]d by L/6 who presents to maternity admissions reporting contractions every 15-25min for several days. Reassuringly no vaginal bleeding, LOF and good fetal movement. Reactive NST and cervix closed/thick/high. Pt with visible contractions on toco, but eased  off after cervical check. Unremarkable UA and wet prep, except for clue cells. Given pt lives nearby she prefers to return home and return if concern of worsening contractions, LOF, vaginal bleeding, decreased fetal movement or other concerns. Specifically counseled that pt should return if contractions occurring every 5 minutes or more for several hours or if difficulty walking/talking through contractions. Pt in agreement with plan.   Plan: Discharged home with plans to f/u with prenatal provider in 2 weeks Preterm Labor precautions and fetal kick counts  Allergies as of 02/01/2020      Reactions   Dilaudid [hydromorphone Hcl] Itching, Rash   Pt states she is allergic to an IV pain medication but unable to recall name of drug   Fentanyl Itching, Rash   Pt states she is allergic to an IV pain medication but unable to recall name of drug   Morphine And Related Itching, Rash   Pt states she is allergic to an IV pain medication but unable to recall name of drug      Medication List    TAKE these medications   Fish Oil 1000 MG Caps Take by mouth.   PRENATAL VITAMINS PO Take 1 tablet by mouth daily.   ZYRTEC ALLERGY PO Take 1 tablet by mouth daily.     Guillermina [place], MD OB Fellow, [facility] 02/01/2020 10:12 PM

## 2020-02-01 NOTE — MAU Note (Signed)
Pt here with reports of contractions, heart palpitations, and feeling anxious that started Saturday. Pt reports that contractions are every 15 mins. Has a lot of pelvic pressure as well. She feels the palpitations when she has the contractions. She denies chest pain, but feels short of breath with the contractions. She denies LOF or vaginal bleeding. Good fetal movement.

## 2020-02-01 NOTE — Discharge Instructions (Signed)
Contracciones de Braxton Hicks °Braxton Hicks Contractions °Las contracciones del útero pueden presentarse durante todo el embarazo, pero no siempre indican que la mujer está de parto. Es posible que usted haya tenido contracciones de práctica llamadas "contracciones de Braxton Hicks". A veces, se las confunde con el parto real. °¿Qué son las contracciones de Braxton Hicks? °Las contracciones de Braxton Hicks son espasmos que se producen en los músculos del útero antes del parto. A diferencia de las contracciones del parto verdadero, estas no producen el agrandamiento (la dilatación) ni el afinamiento del cuello uterino. Hacia el final del embarazo (entre las semanas 32 y 34), las contracciones de Braxton Hicks pueden presentarse más seguido y tornarse más intensas. A veces, resulta difícil distinguirlas del parto verdadero porque pueden ser muy molestas. No debe sentirse avergonzada si concurre al hospital con falso parto. °En ocasiones, la única forma de saber si el trabajo de parto es verdadero es que el médico determine si hay cambios en el cuello del útero. El médico le hará un examen físico y quizás le controle las contracciones. Si usted no está de parto verdadero, el examen debe indicar que el cuello uterino no está dilatado y que usted no ha roto bolsa. °Si no hay otros problemas de salud asociados con su embarazo, no habrá inconvenientes si la envían a su casa con un falso parto. Es posible que las contracciones de Braxton Hicks continúen hasta que se desencadene el parto verdadero. °Cómo diferenciar el trabajo de parto falso del verdadero °Trabajo de parto verdadero °· Las contracciones duran de 30 a 70 segundos. °· Las contracciones pueden tornarse muy regulares. °· La molestia generalmente se siente en la parte superior del útero y se extiende hacia la zona baja del abdomen y hacia la cintura. °· Las contracciones no desaparecen cuando usted camina. °· Las contracciones generalmente se hacen más  intensas y aumentan en frecuencia. °· El cuello uterino se dilata y se afina. °Parto falso °· En general, las contracciones son más cortas y no tan intensas como las del parto verdadero. °· En general, las contracciones son irregulares. °· A menudo, las contracciones se sienten en la parte delantera de la parte baja del abdomen y en la ingle. °· Las contracciones pueden desaparecer cuando usted camina o cambia de posición mientras está acostada. °· Las contracciones se vuelven más débiles y su duración es menor a medida que transcurre el tiempo. °· En general, el cuello uterino no se dilata ni se afina. °Siga estas indicaciones en su casa: ° °· Tome los medicamentos de venta libre y los recetados solamente como se lo haya indicado el médico. °· Continúe haciendo los ejercicios habituales y siga las demás indicaciones que el médico le dé. °· Coma y beba con moderación si cree que está de parto. °· Si las contracciones de Braxton Hicks le provocan incomodidad: °? Cambie de posición: si está acostada o descansando, camine; si está caminando, descanse. °? Siéntese y descanse en una bañera con agua tibia. °? Beba suficiente líquido como para mantener la orina de color amarillo pálido. La deshidratación puede provocar contracciones. °? Respire lenta y profundamente varias veces por hora. °· Vaya a todas las visitas de control prenatales y de control como se lo haya indicado el médico. Esto es importante. °Comuníquese con un médico si: °· Tiene fiebre. °· Siente dolor constante en el abdomen. °Solicite ayuda de inmediato si: °· Las contracciones se intensifican, se hacen más regulares y cercanas entre sí. °· Tiene una pérdida de líquido por la vagina. °· Elimina   una mucosidad sanguinolenta (prdida del tapn mucoso).  Tiene una hemorragia vaginal.  Tiene un dolor en la zona lumbar que nunca tuvo antes.  Siente que la cabeza del beb empuja hacia abajo y ejerce presin en la zona plvica.  El beb no se mueve tanto  como antes. Resumen  Las [employer] se presentan antes del parto se conocen como contracciones de [place] parto o contracciones de Location manager.  En general, las contracciones de Braxton Hicks son ms cortas, ms dbiles, con ms tiempo entre una y [place], y menos regulares que las contracciones del parto verdadero. Las contracciones del parto verdadero se intensifican progresivamente y se tornan regulares y ms frecuentes.  Para controlar la [employer] producen las contracciones de [place], puede cambiar de posicin, darse un bao templado y Production assistant, radio, beber mucha agua o practicar la respiracin profunda. Esta informacin no tiene Marine scientist el consejo del mdico. Asegrese de hacerle al mdico cualquier pregunta que tenga. Document Revised: 09/20/2017 Document Reviewed: 01/21/2017 Elsevier Patient Education  Center.   Evaluacin de los movimientos fetales Fetal Movement Counts Nombre del paciente: ________________________________________________ Karalee Height estimada: ____________________ Allean Found evaluacin de los movimientos fetales?  Una evaluacin de los movimientos fetales es el registro del nmero de veces que siente que el beb se mueve durante un cierto perodo de [place]. Esto tambin se puede denominar recuento de patadas fetales. Una evaluacin de movimientos fetales se recomienda a todas las embarazadas. Es posible que le indiquen que comience a Chief of Staff los movimientos fetales desde la semana 28 de Christmas. Preste atencin cuando sienta que el beb est ms activo. Podr detectar los ciclos en que el beb duerme y est despierto. Tambin podr detectar que ciertas cosas hacen que su beb se mueva ms. Deber realizar una evaluacin de los movimientos fetales en las siguientes situaciones:  Cuando el beb est ms activo habitualmente.  A la [employer], todos los [place]. Un buen momento para evaluar los movimientos fetales es cuando  est descansando, despus de haber comido y bebido algo. Cmo debo contar los movimientos fetales? 1. Encuentre un lugar tranquilo y cmodo. Sintese o acustese de lado. 2. Anote la fecha, la hora de inicio y de finalizacin y la cantidad de movimientos que sinti entre esas dos horas. Lleve esta informacin a las visitas de control. 3. Anote la hora de inicio cuando Scientific laboratory technician. 4. Cuente las pataditas, revoloteos, chasquidos, vueltas o pinchazos. Debe sentir al menos 32movimientos. 5. Puede dejar de contar despus de haber sentido 10 movimientos o de haber contado [employer]. Anote la hora de finalizacin. 6. Si no siente 44movimientos en 2horas, comunquese con su mdico para obtener ms indicaciones. Es posible que el mdico quiera realizar estudios adicionales para TEFL teacher del beb. Comunquese con un mdico si:  Siente menos de 80movimientos en 2horas.  El beb no se mueve tanto como suele hacerlo. Fecha: ____________ Eda Paschal inicio: ____________ Eda Paschal finalizacin: ____________ Movimientos: ____________ Toma Copier: ____________ Eda Paschal inicio: ____________ Eda Paschal finalizacin: ____________ Movimientos: ____________ Toma Copier: ____________ Eda Paschal inicio: ____________ Eda Paschal finalizacin: ____________ Movimientos: ____________ Toma Copier: ____________ Eda Paschal inicio: ____________ Eda Paschal finalizacin: ____________ Movimientos: ____________ Toma Copier: ____________ Eda Paschal inicio: ____________ Eda Paschal finalizacin: ____________ Movimientos: ____________ Toma Copier: ____________ Eda Paschal inicio: ____________ Eda Paschal finalizacin: ____________ Movimientos: ____________ Toma Copier: ____________ Eda Paschal inicio: ____________ Eda Paschal finalizacin: ____________ Movimientos: ____________ Toma Copier: ____________ Eda Paschal inicio: ____________ Mellody Drown de finalizacin: ____________ Movimientos: ____________ Toma Copier:  ____________ Eda Paschal inicio: ____________ Eda Paschal finalizacin: ____________  Movimientos: ____________ Radene Knee informacin no tiene como fin reemplazar el consejo del mdico. Asegrese de hacerle al mdico cualquier pregunta que tenga. Document Revised: 04/07/2019 Document Reviewed: 04/07/2019 Elsevier Patient Education  [place].

## 2020-02-02 LAB — GC/CHLAMYDIA PROBE AMP (~~LOC~~) NOT AT ARMC
Chlamydia: NEGATIVE
Comment: NEGATIVE
Comment: NORMAL
Neisseria Gonorrhea: NEGATIVE

## 2020-02-10 ENCOUNTER — Encounter: Payer: Self-pay | Admitting: Obstetrics and Gynecology

## 2020-02-11 ENCOUNTER — Ambulatory Visit (INDEPENDENT_AMBULATORY_CARE_PROVIDER_SITE_OTHER): Payer: Self-pay | Admitting: Obstetrics & Gynecology

## 2020-02-11 ENCOUNTER — Other Ambulatory Visit: Payer: Self-pay

## 2020-02-11 VITALS — BP 115/71 | HR 92 | Wt 140.0 lb

## 2020-02-11 DIAGNOSIS — G5600 Carpal tunnel syndrome, unspecified upper limb: Secondary | ICD-10-CM

## 2020-02-11 DIAGNOSIS — O0990 Supervision of high risk pregnancy, unspecified, unspecified trimester: Secondary | ICD-10-CM

## 2020-02-11 DIAGNOSIS — O26899 Other specified pregnancy related conditions, unspecified trimester: Secondary | ICD-10-CM

## 2020-02-11 MED ORDER — MAGNESIUM OXIDE -MG SUPPLEMENT 500 MG PO CAPS: 1.0000 | ORAL_CAPSULE | Freq: Every evening | ORAL | 1 refills | Status: DC

## 2020-02-11 MED ORDER — CARPAL TUNNEL WRIST STABILIZER MISC: 2.0000 | 1 refills | Status: DC | PRN

## 2020-02-11 NOTE — Progress Notes (Signed)
   PRENATAL VISIT NOTE  Subjective:  Janice Bernard is a 39 y.o. G4P1021 at [redacted]w[redacted]d being seen today for ongoing prenatal care.  She is currently monitored for the following issues for this high-risk pregnancy and has Supervision of high risk pregnancy, antepartum; AMA (advanced maternal age) multigravida 24+; History of gestational diabetes mellitus (GDM); Fibroid; Hyperthyroidism; History of cesarean section; and Carpal tunnel syndrome during pregnancy on their problem list.  Patient reports carpal tunnel symptoms and pain/swelling in feet. .  Contractions: Not present. Vag. Bleeding: None.  Movement: Present. Denies leaking of fluid.   The following portions of the patient's history were reviewed and updated as appropriate: allergies, current medications, past family history, past medical history, past social history, past surgical history and problem list.   Objective:   Vitals:   02/11/20 0946  BP: 115/71  Pulse: 92  Weight: 140 lb (63.5 kg)    Fetal Status: Fetal Heart Rate (bpm): 144   Movement: Present     General:  Alert, oriented and cooperative. Patient is in no acute distress.  Skin: Skin is warm and dry. No rash noted.   Cardiovascular: Normal heart rate noted  Respiratory: Normal respiratory effort, no problems with respiration noted  Abdomen: Soft, gravid, appropriate for gestational age.  Pain/Pressure: Present     Pelvic: Cervical exam deferred        Extremities: Normal range of motion.  Edema: Trace  Mental Status: Normal mood and affect. Normal behavior. Normal judgment and thought content.   Assessment and Plan:  Pregnancy: [id number] at [redacted]w[redacted]d 1. Carpal tunnel syndrome during pregnancy Will bilateral wrist splints. Discussed timing of wearing braces  2. Supervision of high risk pregnancy, antepartum Pt desires tolac, plan for spontaneous trail until 37-39 weeks  Preterm labor symptoms and general obstetric precautions including but not limited to vaginal  bleeding, contractions, leaking of fluid and fetal movement were reviewed in detail with the patient. Please refer to After Visit Summary for other counseling recommendations.   No follow-ups on file.  Future Appointments  Date Time Provider [place]  02/26/2020  9:15 AM Sloan Leiter, MD [facility]  03/11/2020  9:15 AM Sloan Leiter, MD [facility]  03/18/2020  9:35 AM Donnamae Jude, MD [facility]  03/25/2020  8:35 AM Truett Mainland, DO [facility]    Cherre Blanc, MD

## 2020-02-11 NOTE — Patient Instructions (Signed)
Dolor en la [employer] adultos Wrist Pain, Adult Muchos factores pueden causar dolor en la [place]. Algunas causas comunes son:  Ardelia Mems lesin en la zona de la [place], como un esguince, una distensin o Doctor, hospital.  Uso excesivo de Water engineer.  Una afeccin que causa el aumento de la presin en un nervio de la [place] (sndrome del tnel carpiano).  El desgaste normal de las articulaciones que ocurre con la edad (artrosis).  Otros tipos diferentes de artritis. A veces, la causa del dolor en la mueca no se conoce. El dolor suele desaparecer cuando se siguen las indicaciones del mdico sobre cmo aliviarlo en la casa, como dejar la [place] en reposo o aplicarle hielo. Si el dolor en la mueca contina, es importante consultar al mdico. Siga estas instrucciones en su casa:  Ponga la zona de la mueca en reposo durante por lo menos 48horas o como se lo haya indicado el mdico.  Si le colocaron una frula o una venda elstica, selas como se lo haya indicado el mdico. ? Quteselas solamente como se lo haya indicado el mdico. ? Afloje la frula o la venda si los dedos se le entumecen, siente hormigueos o se le enfran o se tornan de color azul.  Si se lo indican, aplique hielo sobre la zona lesionada. ? Si tiene una frula desmontable o una venda elstica, qutesela como se lo haya indicado el mdico. ? Ponga el hielo en una bolsa plstica. ? Coloque una [employer] piel y la bolsa de hielo o entre la frula o la venda y la bolsa de hielo. ? Coloque el hielo durante 98minutos, 2 o 3veces por da.   Cuando est sentado o acostado, mantenga el brazo elevado por encima del nivel del corazn.  Tome los medicamentos de venta libre y los recetados solamente como se lo haya indicado el mdico.  Consulting civil engineer a todas las visitas de control como se lo haya indicado el mdico. Esto es importante. Comunquese con un mdico si:  Tiene un dolor agudo repentino en la [place], la mano o el  brazo que es diferente o [place].  La hinchazn o los hematomas en la mueca o la mano empeoran.  La piel se enrojece, aparece una erupcin o tiene Personnel officer.  El dolor no mejora o [place]. Solicite ayuda de inmediato si:  Pierde la sensibilidad en la mano o en los dedos de la [place].  Los dedos se tornan de color blanco, estn muy enrojecidos o estn fros y azulados.  No puede mover los dedos.  Tiene fiebre o siente escalofros. Esta informacin no tiene Marine scientist el consejo del mdico. Asegrese de hacerle al mdico cualquier pregunta que tenga. Document Revised: 09/19/2016 Document Reviewed: 12/29/2015 Elsevier Patient Education  [place].   Magnesium Hydroxide chewable tablets Qu es este medicamento? El HIDRXIDO DE MAGNESIO es un laxante y un anticido. Se utiliza para tratar el estreimiento. A veces se [place] para tratar los sntomas de indigestin, estmago agrio o acidez estomacal. [place] medicamento puede ser utilizado para otros usos; si tiene alguna pregunta consulte con su proveedor de atencin mdica o con su farmacutico. MARCAS COMUNES: [place], [place] Negus Laxative, [employer] [place] Qu le debo informar a mi profesional de la salud antes de tomar este medicamento? Necesita saber si usted presenta alguno de los siguientes problemas o situaciones:  enfermedad intestinales o estomacal  cambios en los hbitos intestinales durante ms de 14 das  enfermedad renal  dieta baja en  magnesio  nuseas, vmito  obstruccin o dolor estomacal  una reaccin alrgica o inusual al hidrxido de magnesio, a otros medicamentos, alimentos, colorantes o conservantes  si est embarazada o buscando quedar embarazada  si est amamantando a un beb Cmo debo [employer]? Tome este medicamento por va oral. Mastique completamente antes de tragarlo. Siga las instrucciones de la etiqueta o paquete del medicamento. Despus de tomar el  medicamento, beba un vaso lleno de agua. Tome sus dosis a intervalos regulares. No tome su medicamento con una frecuencia mayor a la indicada. Hable con su pediatra para informarse acerca del uso de este medicamento en nios. Aunque este medicamento se puede recetar para condiciones selectivas, las precauciones se aplican. Sobredosis: Pngase en contacto inmediatamente con un centro toxicolgico o una sala de urgencia si usted cree que haya tomado demasiado medicamento. ATENCIN: [employer] es solo para usted. No comparta este medicamento con nadie. Qu sucede si me olvido de una dosis? Si olvida una dosis, tmela lo antes posible. Si es casi la hora de la prxima dosis, tome slo esa dosis. No tome dosis adicionales o dobles. Qu puede interactuar con este medicamento?  antibiticos  delavirdina  gabapentina  lactulosa  medicamentos para infecciones micticas, tales como itraconazol y Arboriculturist  medicamentos para osteoporosis, tales como alendronato, Air traffic controller, risedronato y tiludronato  medicamentos para convulsiones, tales como etotona y fenitona  metenamina  otros suplementos, laxantes o anticidos que contengan magnesio  fenotiazinas, tales como clorpromacina, Musician, Government social research officer, tioridazina  quinidina  rosuvastatina  sulfonato sdico de poliestireno  sotalol  vitamina D Puede ser que esta lista no menciona todas las posibles interacciones. Informe a su profesional de [employer]	[place] de [employer] productos a base de hierbas, medicamentos de [place] o suplementos nutritivos que est tomando. Si usted fuma, consume bebidas alcohlicas o si utiliza drogas ilegales, indqueselo tambin a su profesional de [employer]	[place]. Algunas sustancias pueden interactuar con su medicamento. A qu debo estar atento al usar [employer]? Si sus sntomas no comienzan a mejorar o si empeoran, informe a su mdico o su profesional de [employer]	[place]. Si tiene estreimiento, no se  d tratamiento usted mismo con este medicamento durante ms de 1 semanas. Si observa heces de color oscuro o aspecto alquitranado, si sufre una hemorragia rectal o si se siente inusualmente cansado, comunquese con su mdico. No cambie a otro producto laxante sin asesoramiento. Si est tomando otros medicamentos, debe transcurrir un perodo de por lo menos 2 horas antes o despus de Systems developer. Beba varios vasos de agua por da para ayudarle a Production assistant, radio. Qu efectos secundarios puedo tener al [employer] este medicamento? Efectos secundarios que debe informar a su mdico o a Barrister's clerk de la salud tan pronto como sea posible:  Chief of Staff como erupcin cutnea, picazn o urticarias, hinchazn de la cara, labios o lengua  confusin  sensacin de desmayos o mareos, cadas  prdida del apetito  nuseas, vmito  hemorragia rectal  cansancio o debilidad inusual Efectos secundarios que, por lo general, no requieren atencin mdica (debe informarlos a su mdico o a su profesional de la salud si persisten o si son molestos):  sabor calcreo  diarrea  calambres estomacales Puede ser que esta lista no menciona todos los posibles efectos secundarios. Comunquese a su mdico por asesoramiento mdico [employer]. Usted puede informar los efectos secundarios a la FDA por telfono al 1-800-FDA-1088. Dnde debo guardar mi medicina? Mantngala fuera del alcance de los nios. Namon Cirri  a temperatura ambiente de entre 45 y 25 grados C (24 y 44 grados F). No la congele. Protjala de la [place] y de la humedad. Deseche todo el medicamento que no haya utilizado, despus de la fecha de vencimiento. ATENCIN: Este folleto es un resumen. Puede ser que no cubra toda la posible informacin. Si usted tiene preguntas acerca de esta medicina, consulte con su mdico, su farmacutico o su profesional de Technical sales engineer.  2020 Elsevier/Gold Standard (2014-08-03  00:00:00)

## 2020-02-26 ENCOUNTER — Encounter: Payer: Self-pay | Admitting: Obstetrics and Gynecology

## 2020-02-26 ENCOUNTER — Encounter: Payer: Self-pay | Admitting: General Practice

## 2020-03-05 ENCOUNTER — Other Ambulatory Visit: Payer: Self-pay

## 2020-03-05 ENCOUNTER — Inpatient Hospital Stay (HOSPITAL_COMMUNITY)
Admission: AD | Admit: 2020-03-05 | Discharge: 2020-03-05 | Disposition: A | Discharge status: Home / Self Care | Payer: Self-pay | Attending: Obstetrics & Gynecology | Admitting: Obstetrics & Gynecology

## 2020-03-05 ENCOUNTER — Encounter (HOSPITAL_COMMUNITY): Payer: Self-pay | Admitting: Obstetrics & Gynecology

## 2020-03-05 DIAGNOSIS — O4703 False labor before 37 completed weeks of gestation, third trimester: Secondary | ICD-10-CM | POA: Insufficient documentation

## 2020-03-05 DIAGNOSIS — Z3A35 35 weeks gestation of pregnancy: Secondary | ICD-10-CM

## 2020-03-05 DIAGNOSIS — Z0371 Encounter for suspected problem with amniotic cavity and membrane ruled out: Secondary | ICD-10-CM

## 2020-03-05 NOTE — Discharge Instructions (Signed)

## 2020-03-05 NOTE — MAU Provider Note (Signed)
First Provider Initiated Contact with Patient 03/05/20 1929     S: Ms. Janice Bernard is a 39 y.o. [id number] at [redacted]w[redacted]d  who presents to MAU today complaining of leaking of fluid since 1800. She denies vaginal bleeding. She denies contractions. She reports normal fetal movement.    O: BP 108/77 (BP Location: Left Arm)   Pulse 93   Temp 97.9 F (36.6 C) (Oral)   Resp 16   Ht 5' (1.524 m)   Wt 65.5 kg   LMP 07/03/2019   BMI 28.20 kg/m  GENERAL: Well-developed, well-nourished female in no acute distress.  HEAD: Normocephalic, atraumatic.  CHEST: Normal effort of breathing, regular heart rate ABDOMEN: Soft, nontender, gravid PELVIC: Normal external female genitalia. Vagina is pink and rugated. Cervix with normal contour, no lesions. Normal discharge.  No pooling.   Cervical exam:   Closed/thick/posterior   Fetal Monitoring: Baseline: 120 Variability: moderate Accelerations: 15x15 Decelerations: none Contractions: occasional uc's  Fern negative. No signs of labor or rupture of membranes  A: SIUP at [redacted]w[redacted]d  Membranes intact  P: -Discharge home in stable condition -Preterm labor precautions discussed -Patient advised to follow-up with OB as scheduled for prenatal care -Patient may return to MAU as needed or if her condition were to change or worsen   Wende Mott, [place] 03/05/2020 7:52 PM

## 2020-03-05 NOTE — MAU Note (Signed)
..  Jamye Balicki is a 39 y.o. at [redacted]w[redacted]d here in MAU reporting: A gush of fluid around 1800 and ctx every 20 minutes. +FM. Denies Vaginal bleeding   Pain score: 5/10 Vitals:   03/05/20 1921  BP: 108/77  Pulse: 93  Resp: 16  Temp: 97.9 F (36.6 C)

## 2020-03-11 ENCOUNTER — Telehealth: Payer: Self-pay | Admitting: Obstetrics and Gynecology

## 2020-03-11 ENCOUNTER — Encounter: Payer: Self-pay | Admitting: Obstetrics and Gynecology

## 2020-03-15 NOTE — Telephone Encounter (Signed)
Patient was called and rescheduled on 03/14/2020.

## 2020-03-18 ENCOUNTER — Ambulatory Visit (INDEPENDENT_AMBULATORY_CARE_PROVIDER_SITE_OTHER): Payer: Self-pay | Admitting: Family Medicine

## 2020-03-18 ENCOUNTER — Other Ambulatory Visit (HOSPITAL_COMMUNITY)
Admission: RE | Admit: 2020-03-18 | Discharge: 2020-03-18 | Disposition: A | Discharge status: Home / Self Care | Payer: Self-pay | Source: Ambulatory Visit | Attending: Family Medicine | Admitting: Family Medicine

## 2020-03-18 ENCOUNTER — Other Ambulatory Visit: Payer: Self-pay

## 2020-03-18 VITALS — BP 120/87 | HR 96 | Wt 145.8 lb

## 2020-03-18 DIAGNOSIS — O0993 Supervision of high risk pregnancy, unspecified, third trimester: Secondary | ICD-10-CM

## 2020-03-18 DIAGNOSIS — Z3A37 37 weeks gestation of pregnancy: Secondary | ICD-10-CM

## 2020-03-18 DIAGNOSIS — O0990 Supervision of high risk pregnancy, unspecified, unspecified trimester: Secondary | ICD-10-CM | POA: Insufficient documentation

## 2020-03-18 DIAGNOSIS — Z8632 Personal history of gestational diabetes: Secondary | ICD-10-CM

## 2020-03-18 DIAGNOSIS — Z98891 History of uterine scar from previous surgery: Secondary | ICD-10-CM

## 2020-03-18 DIAGNOSIS — O09522 Supervision of elderly multigravida, second trimester: Secondary | ICD-10-CM

## 2020-03-18 DIAGNOSIS — O09523 Supervision of elderly multigravida, third trimester: Secondary | ICD-10-CM

## 2020-03-18 DIAGNOSIS — L299 Pruritus, unspecified: Secondary | ICD-10-CM

## 2020-03-18 MED ORDER — BETAMETHASONE DIPROPIONATE 0.05 % EX CREA: TOPICAL_CREAM | Freq: Two times a day (BID) | CUTANEOUS | 0 refills | Status: DC

## 2020-03-18 MED ORDER — HYDROXYZINE PAMOATE 25 MG PO CAPS: 25.0000 mg | ORAL_CAPSULE | Freq: Three times a day (TID) | ORAL | 0 refills | Status: DC | PRN

## 2020-03-18 NOTE — Patient Instructions (Signed)
 Lactancia materna Breastfeeding  Decidir amamantar es una de las mejores elecciones que puede hacer por usted y su beb. Un cambio en las hormonas durante el embarazo hace que las mamas produzcan leche materna en las glndulas productoras de leche. Las hormonas impiden que la leche materna sea liberada antes del nacimiento del beb. Adems, impulsan el flujo de leche luego del nacimiento. Una vez que ha comenzado a amamantar, pensar en el beb, as como la succin o el llanto, pueden estimular la liberacin de leche de las glndulas productoras de leche. Los beneficios de amamantar Las investigaciones demuestran que la lactancia materna ofrece muchos beneficios de salud para bebs y madres. Adems, ofrece una forma gratuita y conveniente de alimentar al beb. Para el beb  La primera leche (calostro) ayuda a mejorar el funcionamiento del aparato digestivo del beb.  Las clulas especiales de la leche (anticuerpos) ayudan a combatir las infecciones en el beb.  Los bebs que se alimentan con leche materna tambin tienen menos probabilidades de tener asma, alergias, obesidad o diabetes de tipo 2. Adems, tienen menor riesgo de sufrir el sndrome de muerte sbita del lactante (SMSL).  Los nutrientes de la leche materna son mejores para satisfacer las necesidades del beb en comparacin con la leche maternizada.  La leche materna mejora el desarrollo cerebral del beb. Para usted  La lactancia materna favorece el desarrollo de un vnculo muy especial entre la madre y el beb.  Es conveniente. La leche materna es econmica y siempre est disponible a la temperatura correcta.  La lactancia materna ayuda a quemar caloras. Le ayuda a perder el peso ganado durante el embarazo.  Hace que el tero vuelva al tamao que tena antes del embarazo ms rpido. Adems, disminuye el sangrado (loquios) despus del parto.  La lactancia materna contribuye a reducir el riesgo de tener diabetes de tipo 2,  osteoporosis, artritis reumatoide, enfermedades cardiovasculares y cncer de mama, ovario, tero y endometrio en el futuro. Informacin bsica sobre la lactancia Comienzo de la lactancia  Encuentre un lugar cmodo para sentarse o acostarse, con un buen respaldo para el cuello y la espalda.  Coloque una almohada o una manta enrollada debajo del beb para acomodarlo a la altura de la mama (si est sentada). Las almohadas para amamantar se han diseado especialmente a fin de servir de apoyo para los brazos y el beb mientras amamanta.  Asegrese de que la barriga del beb (abdomen) est frente a la suya.  Masajee suavemente la mama. Con las yemas de los dedos, masajee los bordes exteriores de la mama hacia adentro, en direccin al pezn. Esto estimula el flujo de leche. Si la leche fluye lentamente, es posible que deba continuar con este movimiento durante la lactancia.  Sostenga la mama con 4 dedos por debajo y el pulgar por arriba del pezn (forme la letra "C" con la mano). Asegrese de que los dedos se encuentren lejos del pezn y de la boca del beb.  Empuje suavemente los labios del beb con el pezn o con el dedo.  Cuando la boca del beb se abra lo suficiente, acrquelo rpidamente a la mama e introduzca todo el pezn y la arola, tanto como sea posible, dentro de la boca del beb. La arola es la zona de color que rodea al pezn. ? Debe haber ms arola visible por arriba del labio superior del beb que por debajo del labio inferior. ? Los labios del beb deben estar abiertos y extendidos hacia afuera (evertidos) para asegurar   que el beb se prenda de forma adecuada y cmoda. ? La lengua del beb debe estar entre la enca inferior y la mama.  Asegrese de que la boca del beb est en la posicin correcta alrededor del pezn (prendido). Los labios del beb deben crear un sello sobre la mama y estar doblados hacia afuera (invertidos).  Es comn que el beb succione durante 2 a 3 minutos  para que comience el flujo de leche materna. Cmo debe prenderse Es muy importante que le ensee al beb cmo prenderse adecuadamente a la mama. Si el beb no se prende adecuadamente, puede causar dolor en los pezones, reducir la produccin de leche materna y hacer que el beb tenga un escaso aumento de peso. Adems, si el beb no se prende adecuadamente al pezn, puede tragar aire durante la alimentacin. Esto puede causarle molestias al beb. Hacer eructar al beb al cambiar de mama puede ayudarlo a liberar el aire. Sin embargo, ensearle al beb cmo prenderse a la mama adecuadamente es la mejor manera de evitar que se sienta molesto por tragar aire mientras se alimenta. Signos de que el beb se ha prendido adecuadamente al pezn  Tironea o succiona de modo silencioso, sin causarle dolor. Los labios del beb deben estar extendidos hacia afuera (evertidos).  Se escucha que traga cada 3 o 4 succiones una vez que la leche ha comenzado a fluir (despus de que se produzca el reflejo de eyeccin de la leche).  Hay movimientos musculares por arriba y por delante de sus odos al succionar. Signos de que el beb no se ha prendido adecuadamente al pezn  Hace ruidos de succin o de chasquido mientras se alimenta.  Siente dolor en los pezones. Si cree que el beb no se prendi correctamente, deslice el dedo en la comisura de la boca y colquelo entre las encas del beb para interrumpir la succin. Intente volver a comenzar a amamantar. Signos de lactancia materna exitosa Signos del beb  El beb disminuir gradualmente el nmero de succiones o dejar de succionar por completo.  El beb se quedar dormido.  El cuerpo del beb se relajar.  El beb retendr una pequea cantidad de leche en la boca.  El beb se desprender solo del pecho. Signos que presenta usted  Las mamas han aumentado la firmeza, el peso y el tamao 1 a 3 horas despus de amamantar.  Estn ms blandas inmediatamente despus  de amamantar.  Se producen un aumento del volumen de leche y un cambio en su consistencia y color hacia el quinto da de lactancia.  Los pezones no duelen, no estn agrietados ni sangran. Signos de que su beb recibe la cantidad de leche suficiente  Mojar por lo menos 1 o 2paales durante las primeras 24horas despus del nacimiento.  Mojar por lo menos 5 o 6paales cada 24horas durante la primera semana despus del nacimiento. La orina debe ser clara o de color amarillo plido a los 5das de vida.  Mojar entre 6 y 8paales cada 24horas a medida que el beb sigue creciendo y desarrollndose.  Defeca por lo menos 3 veces en 24 horas a los 5 das de vida. Las heces deben ser blandas y amarillentas.  Defeca por lo menos 3 veces en 24 horas a los 7 das de vida. Las heces deben ser grumosas y amarillentas.  No registra una prdida de peso mayor al 10% del peso al nacer durante los primeros 3 das de vida.  Aumenta de peso un promedio de 4   a 7onzas (113 a 198g) por semana despus de los 4 das de vida.  Aumenta de peso, diariamente, de manera uniforme a partir de los 5 das de vida, sin registrar prdida de peso despus de las 2semanas de vida. Despus de alimentarse, es posible que el beb regurgite una pequea cantidad de leche. Esto es normal. Frecuencia y duracin de la lactancia El amamantamiento frecuente la ayudar a producir ms leche y puede prevenir dolores en los pezones y las mamas extremadamente llenas (congestin mamaria). Alimente al beb cuando muestre signos de hambre o si siente la necesidad de reducir la congestin de las mamas. Esto se denomina "lactancia a demanda". Las seales de que el beb tiene hambre incluyen las siguientes:  Aumento del estado de alerta, actividad o inquietud.  Mueve la cabeza de un lado a otro.  Abre la boca cuando se le toca la mejilla o la comisura de la boca (reflejo de bsqueda).  Aumenta las vocalizaciones, tales como sonidos de  succin, se relame los labios, emite arrullos, suspiros o chirridos.  Mueve la mano hacia la boca y se chupa los dedos o las manos.  Est molesto o llora. Evite el uso del chupete en las primeras 4 a 6 semanas despus del nacimiento del beb. Despus de este perodo, podr usar un chupete. Las investigaciones demostraron que el uso del chupete durante el primer ao de vida del beb disminuye el riesgo de tener el sndrome de muerte sbita del lactante (SMSL). Permita que el nio se alimente en cada mama todo lo que desee. Cuando el beb se desprende o se queda dormido mientras se est alimentando de la primera mama, ofrzcale la segunda. Debido a que, con frecuencia, los recin nacidos estn somnolientos las primeras semanas de vida, es posible que deba despertar al beb para alimentarlo. Los horarios de lactancia varan de un beb a otro. Sin embargo, las siguientes reglas pueden servir como gua para ayudarla a garantizar que el beb se alimenta adecuadamente:  Se puede amamantar a los recin nacidos (bebs de 4 semanas o menos de vida) cada 1 a 3 horas.  No deben transcurrir ms de 3 horas durante el da o 5 horas durante la noche sin que se amamante a los recin nacidos.  Debe amamantar al beb un mnimo de 8 veces en un perodo de 24 horas. Extraccin de leche materna     La extraccin y el almacenamiento de la leche materna le permiten asegurarse de que el beb se alimente exclusivamente de su leche materna, aun en momentos en los que no puede amamantar. Esto tiene especial importancia si debe regresar al trabajo en el perodo en que an est amamantando o si no puede estar presente en los momentos en que el beb debe alimentarse. Su asesor en lactancia puede ayudarla a encontrar un mtodo de extraccin que funcione mejor para usted y orientarla sobre cunto tiempo es seguro almacenar leche materna. Cmo cuidar las mamas durante la lactancia Los pezones pueden secarse, agrietarse y doler  durante la lactancia. Las siguientes recomendaciones pueden ayudarla a mantener las mamas humectadas y sanas:  Evite usar jabn en los pezones.  Use un sostn de soporte diseado especialmente para la lactancia materna. Evite usar sostenes con aro o sostenes muy ajustados (sostenes deportivos).  Seque al aire sus pezones durante 3 a 4minutos despus de amamantar al beb.  Utilice solo apsitos de algodn en el sostn para absorber las prdidas de leche. La prdida de un poco de leche materna entre   las tomas es normal.  Utilice lanolina sobre los pezones luego de amamantar. La lanolina ayuda a mantener la humedad normal de la piel. La lanolina pura no es perjudicial (no es txica) para el beb. Adems, puede extraer manualmente algunas gotas de leche materna y masajear suavemente esa leche sobre los pezones para que la leche se seque al aire. Durante las primeras semanas despus del nacimiento, algunas mujeres experimentan congestin mamaria. La congestin mamaria puede hacer que sienta las mamas pesadas, calientes y sensibles al tacto. El pico de la congestin mamaria ocurre en el plazo de los 3 a 5 das despus del parto. Las siguientes recomendaciones pueden ayudarla a aliviar la congestin mamaria:  Vace por completo las mamas al amamantar o extraer leche. Puede aplicar calor hmedo en las mamas (en la ducha o con toallas hmedas para manos) antes de amamantar o extraer leche. Esto aumenta la circulacin y ayuda a que la leche fluya. Si el beb no vaca por completo las mamas cuando lo amamanta, extraiga la leche restante despus de que haya finalizado.  Aplique compresas de hielo sobre las mamas inmediatamente despus de amamantar o extraer leche, a menos que le resulte demasiado incmodo. Haga lo siguiente: ? Ponga el hielo en una bolsa plstica. ? Coloque una toalla entre la piel y la bolsa de hielo. ? Coloque el hielo durante 20minutos, 2 o 3veces por da.  Asegrese de que el beb  est prendido y se encuentre en la posicin correcta mientras lo alimenta. Si la congestin mamaria persiste luego de 48 horas o despus de seguir estas recomendaciones, comunquese con su mdico o un asesor en lactancia. Recomendaciones de salud general durante la lactancia  Consuma 3 comidas y 3 colaciones saludables todos los das. Las madres bien alimentadas que amamantan necesitan entre 450 y 500 caloras adicionales por da. Puede cumplir con este requisito al aumentar la cantidad de una dieta equilibrada que realice.  Beba suficiente agua para mantener la orina clara o de color amarillo plido.  Descanse con frecuencia, reljese y siga tomando sus vitaminas prenatales para prevenir la fatiga, el estrs y los niveles bajos de vitaminas y minerales en el cuerpo (deficiencias de nutrientes).  No consuma ningn producto que contenga nicotina o tabaco, como cigarrillos y cigarrillos electrnicos. El beb puede verse afectado por las sustancias qumicas de los cigarrillos que pasan a la leche materna y por la exposicin al humo ambiental del tabaco. Si necesita ayuda para dejar de fumar, consulte al mdico.  Evite el consumo de alcohol.  No consuma drogas ilegales o marihuana.  Antes de usar cualquier medicamento, hable con el mdico. Estos incluyen medicamentos recetados y de venta libre, como tambin vitaminas y suplementos a base de hierbas. Algunos medicamentos, que pueden ser perjudiciales para el beb, pueden pasar a travs de la leche materna.  Puede quedar embarazada durante la lactancia. Si se desea un mtodo anticonceptivo, consulte al mdico sobre cules son las opciones seguras durante la lactancia. Dnde encontrar ms informacin: Liga internacional La Leche: www.llli.org. Comunquese con un mdico si:  Siente que quiere dejar de amamantar o se siente frustrada con la lactancia.  Sus pezones estn agrietados o sangran.  Sus mamas estn irritadas, sensibles o  calientes.  Tiene los siguientes sntomas: ? Dolor en las mamas o en los pezones. ? Un rea hinchada en cualquiera de las mamas. ? Fiebre o escalofros. ? Nuseas o vmitos. ? Drenaje de otro lquido distinto de la leche materna desde los pezones.  Sus mamas no   se llenan antes de amamantar al beb para el quinto da despus del parto.  Se siente triste y deprimida.  El beb: ? Est demasiado somnoliento como para comer bien. ? Tiene problemas para dormir. ? Tiene ms de 1 semana de vida y moja menos de 6 paales en un periodo de 24 horas. ? No ha aumentado de peso a los 5 das de vida.  El beb defeca menos de 3 veces en 24 horas.  La piel del beb o las partes blancas de los ojos se vuelven amarillentas. Solicite ayuda de inmediato si:  El beb est muy cansado (letargo) y no se quiere despertar para comer.  Le sube la fiebre sin causa. Resumen  La lactancia materna ofrece muchos beneficios de salud para bebs y madres.  Intente amamantar a su beb cuando muestre signos tempranos de hambre.  Haga cosquillas o empuje suavemente los labios del beb con el dedo o el pezn para lograr que el beb abra la boca. Acerque el beb a la mama. Asegrese de que la mayor parte de la arola se encuentre dentro de la boca del beb. Ofrzcale una mama y haga eructar al beb antes de pasar a la otra.  Hable con su mdico o asesor en lactancia si tiene dudas o problemas con la lactancia. Esta informacin no tiene como fin reemplazar el consejo del mdico. Asegrese de hacerle al mdico cualquier pregunta que tenga. Document Revised: 09/05/2017 Document Reviewed: 10/01/2016 Elsevier Patient Education  2020 [employer]  

## 2020-03-18 NOTE — Progress Notes (Signed)
Pt reports itching for the past few weeks to legs, feet, stomach, and hands. Reports applying benadryl cream after a shower provides relief for 10 minutes.   Apolonio Schneiders RN 03/18/20

## 2020-03-19 LAB — BILE ACIDS, TOTAL: Bile Acids Total: 5.2 umol/L (ref 0.0–10.0)

## 2020-03-20 NOTE — Progress Notes (Signed)
° ° °  PRENATAL VISIT NOTE  Subjective:  Janice Bernard is a 39 y.o. G4P1021 at [redacted]w[redacted]d being seen today for ongoing prenatal care.  She is currently monitored for the following issues for this high-risk pregnancy and has Supervision of high risk pregnancy, antepartum; AMA (advanced maternal age) multigravida 49+; History of gestational diabetes mellitus (GDM); Fibroid; Hyperthyroidism; History of cesarean section; and Carpal tunnel syndrome during pregnancy on their problem list.  Patient reports diffuse itching, worse at night, worse on abdomen. Had similar issue last pregnancy.  Contractions: Irritability. Vag. Bleeding: None.  Movement: Present. Denies leaking of fluid.   The following portions of the patient's history were reviewed and updated as appropriate: allergies, current medications, past family history, past medical history, past social history, past surgical history and problem list.   Objective:   Vitals:   03/18/20 1000  BP: 120/87  Pulse: 96  Weight: 145 lb 12.8 oz (66.1 kg)    Fetal Status: Fetal Heart Rate (bpm): 154 Fundal Height: 36 cm Movement: Present  Presentation: Vertex  General:  Alert, oriented and cooperative. Patient is in no acute distress.  Skin: Skin is warm and dry. No rash noted.   Cardiovascular: Normal heart rate noted  Respiratory: Normal respiratory effort, no problems with respiration noted  Abdomen: Soft, gravid, appropriate for gestational age.  Pain/Pressure: Present     Pelvic: Cervical exam performed in the presence of a chaperone Dilation: Closed Effacement (%): 10 Station: -2  Extremities: Normal range of motion.  Edema: Trace  Mental Status: Normal mood and affect. Normal behavior. Normal judgment and thought content.   Assessment and Plan:  Pregnancy: G4P1021 at [redacted]w[redacted]d 1. Supervision of high risk pregnancy, antepartum Cultures today - Culture, beta strep (group b only) - GC/Chlamydia probe amp ([place])not at [facility]  2. History of  cesarean section For TOLAC  3. History of gestational diabetes mellitus (GDM) No evidence of DM now  4. Multigravida of advanced maternal age in second trimester Declined genetics  5. Pruritus Check bile acids, if elevated would need IOL given GA > 37 wks. Steroid cream and vistaril for itching until labs are returned. - Bile acids, total - betamethasone dipropionate 0.05 % cream; Apply topically 2 (two) times daily.  Dispense: 45 g; Refill: 0 - hydrOXYzine (VISTARIL) 25 MG capsule; Take 1 capsule (25 mg total) by mouth 3 (three) times daily as needed.  Dispense: 30 capsule; Refill: 0  Term labor symptoms and general obstetric precautions including but not limited to vaginal bleeding, contractions, leaking of fluid and fetal movement were reviewed in detail with the patient. Please refer to After Visit Summary for other counseling recommendations.   Return in 1 week (on 03/25/2020).  Future Appointments  Date Time Provider [place]  03/25/2020  8:35 AM Truett Mainland, DO [facility]    Donnamae Jude, MD

## 2020-03-21 ENCOUNTER — Ambulatory Visit (INDEPENDENT_AMBULATORY_CARE_PROVIDER_SITE_OTHER): Payer: Self-pay | Admitting: *Deleted

## 2020-03-21 ENCOUNTER — Encounter: Payer: Self-pay | Admitting: Family Medicine

## 2020-03-21 DIAGNOSIS — L290 Pruritus ani: Secondary | ICD-10-CM

## 2020-03-21 DIAGNOSIS — L299 Pruritus, unspecified: Secondary | ICD-10-CM

## 2020-03-21 DIAGNOSIS — O99820 Streptococcus B carrier state complicating pregnancy: Secondary | ICD-10-CM | POA: Insufficient documentation

## 2020-03-21 LAB — GC/CHLAMYDIA PROBE AMP (~~LOC~~) NOT AT ARMC
Chlamydia: NEGATIVE
Comment: NEGATIVE
Comment: NORMAL
Neisseria Gonorrhea: NEGATIVE

## 2020-03-21 LAB — CULTURE, BETA STREP (GROUP B ONLY): Strep Gp B Culture: POSITIVE — AB

## 2020-03-21 MED ORDER — TRIAMCINOLONE ACETONIDE 0.5 % EX CREA: 1.0000 "application " | TOPICAL_CREAM | Freq: Two times a day (BID) | CUTANEOUS | 0 refills | Status: DC

## 2020-03-21 NOTE — Progress Notes (Signed)
Pt presented to front desk stating that she did not understand her test results from visit on Friday 9/24. I spoke with pt using video interpreter Marquis Lunch [phone]. Pt stated that she was told she would be called with her results on Saturday or Sunday and she did not receive a call. I apologized for any confusion and advised that she would have been called if the test result (bile acids) had been abnormal however it was normal and no change in plan of care was needed. Pt then stated that her itching and rash has become worse since last visit. She further stated that the rash has spread to her entire abdomen - rather that just around belly button. Per exam, pt does have a pink rash which is slightly raised on anterior and both sides of abdomen. During the visit, pt was observed scratching both forearms and backs of hands however no rash was visible on these areas. Per consult with Dr. Nehemiah Settle, pt was prescribed Triamcinolone cream and was advised to take Zyrtec or Claritin - 1 tablet by mouth daily.  Pt was provided with these instructions as well as to keep scheduled appt on 10/1 @ 0835. She voiced understanding.

## 2020-03-21 NOTE — Progress Notes (Signed)
Chart reviewed - agree with CMA/RN documentation.  ° °

## 2020-03-24 ENCOUNTER — Encounter (HOSPITAL_COMMUNITY): Payer: Self-pay | Admitting: Family Medicine

## 2020-03-24 ENCOUNTER — Encounter (HOSPITAL_COMMUNITY)
Admission: AD | Disposition: A | Discharge status: Home / Self Care | Payer: Self-pay | Source: Home / Self Care | Attending: Obstetrics & Gynecology

## 2020-03-24 ENCOUNTER — Other Ambulatory Visit: Payer: Self-pay

## 2020-03-24 ENCOUNTER — Inpatient Hospital Stay (HOSPITAL_COMMUNITY): Payer: Medicaid Other | Admitting: Anesthesiology

## 2020-03-24 ENCOUNTER — Inpatient Hospital Stay (HOSPITAL_COMMUNITY)
Admission: AD | Admit: 2020-03-24 | Discharge: 2020-03-26 | DRG: 788 | Disposition: A | Discharge status: Home / Self Care | Payer: Medicaid Other | Attending: Obstetrics & Gynecology | Admitting: Obstetrics & Gynecology

## 2020-03-24 DIAGNOSIS — O4292 Full-term premature rupture of membranes, unspecified as to length of time between rupture and onset of labor: Secondary | ICD-10-CM | POA: Diagnosis present

## 2020-03-24 DIAGNOSIS — O0990 Supervision of high risk pregnancy, unspecified, unspecified trimester: Secondary | ICD-10-CM

## 2020-03-24 DIAGNOSIS — B951 Streptococcus, group B, as the cause of diseases classified elsewhere: Secondary | ICD-10-CM

## 2020-03-24 DIAGNOSIS — O99824 Streptococcus B carrier state complicating childbirth: Secondary | ICD-10-CM | POA: Diagnosis present

## 2020-03-24 DIAGNOSIS — O26893 Other specified pregnancy related conditions, third trimester: Secondary | ICD-10-CM | POA: Diagnosis present

## 2020-03-24 DIAGNOSIS — O24439 Gestational diabetes mellitus in the puerperium, unspecified control: Secondary | ICD-10-CM | POA: Diagnosis not present

## 2020-03-24 DIAGNOSIS — O4202 Full-term premature rupture of membranes, onset of labor within 24 hours of rupture: Secondary | ICD-10-CM

## 2020-03-24 DIAGNOSIS — Z8632 Personal history of gestational diabetes: Secondary | ICD-10-CM | POA: Diagnosis present

## 2020-03-24 DIAGNOSIS — O34211 Maternal care for low transverse scar from previous cesarean delivery: Secondary | ICD-10-CM | POA: Diagnosis present

## 2020-03-24 DIAGNOSIS — Z3A37 37 weeks gestation of pregnancy: Secondary | ICD-10-CM

## 2020-03-24 DIAGNOSIS — D219 Benign neoplasm of connective and other soft tissue, unspecified: Secondary | ICD-10-CM

## 2020-03-24 DIAGNOSIS — O99285 Endocrine, nutritional and metabolic diseases complicating the puerperium: Secondary | ICD-10-CM | POA: Diagnosis not present

## 2020-03-24 DIAGNOSIS — E039 Hypothyroidism, unspecified: Secondary | ICD-10-CM | POA: Diagnosis not present

## 2020-03-24 DIAGNOSIS — N841 Polyp of cervix uteri: Secondary | ICD-10-CM | POA: Diagnosis present

## 2020-03-24 DIAGNOSIS — O324XX0 Maternal care for high head at term, not applicable or unspecified: Secondary | ICD-10-CM

## 2020-03-24 DIAGNOSIS — O24419 Gestational diabetes mellitus in pregnancy, unspecified control: Secondary | ICD-10-CM

## 2020-03-24 DIAGNOSIS — Z20822 Contact with and (suspected) exposure to covid-19: Secondary | ICD-10-CM | POA: Diagnosis present

## 2020-03-24 DIAGNOSIS — O3443 Maternal care for other abnormalities of cervix, third trimester: Secondary | ICD-10-CM | POA: Diagnosis not present

## 2020-03-24 DIAGNOSIS — O99820 Streptococcus B carrier state complicating pregnancy: Secondary | ICD-10-CM

## 2020-03-24 DIAGNOSIS — O09529 Supervision of elderly multigravida, unspecified trimester: Secondary | ICD-10-CM

## 2020-03-24 DIAGNOSIS — O4290 Premature rupture of membranes, unspecified as to length of time between rupture and onset of labor, unspecified weeks of gestation: Secondary | ICD-10-CM | POA: Diagnosis present

## 2020-03-24 DIAGNOSIS — Z98891 History of uterine scar from previous surgery: Secondary | ICD-10-CM

## 2020-03-24 DIAGNOSIS — E0590 Thyrotoxicosis, unspecified without thyrotoxic crisis or storm: Secondary | ICD-10-CM | POA: Diagnosis present

## 2020-03-24 DIAGNOSIS — O322XX0 Maternal care for transverse and oblique lie, not applicable or unspecified: Secondary | ICD-10-CM

## 2020-03-24 LAB — CBC
HCT: 36.2 % (ref 36.0–46.0)
Hemoglobin: 12.2 g/dL (ref 12.0–15.0)
MCH: 30 pg (ref 26.0–34.0)
MCHC: 33.7 g/dL (ref 30.0–36.0)
MCV: 88.9 fL (ref 80.0–100.0)
Platelets: 140 10*3/uL — ABNORMAL LOW (ref 150–400)
RBC: 4.07 MIL/uL (ref 3.87–5.11)
RDW: 13.9 % (ref 11.5–15.5)
WBC: 7.2 10*3/uL (ref 4.0–10.5)
nRBC: 0 % (ref 0.0–0.2)

## 2020-03-24 LAB — RESPIRATORY PANEL BY RT PCR (FLU A&B, COVID)
Influenza A by PCR: NEGATIVE
Influenza B by PCR: NEGATIVE
SARS Coronavirus 2 by RT PCR: NEGATIVE

## 2020-03-24 LAB — TYPE AND SCREEN
ABO/RH(D): O POS
Antibody Screen: NEGATIVE

## 2020-03-24 LAB — RPR: RPR Ser Ql: NONREACTIVE

## 2020-03-24 SURGERY — Surgical Case
Anesthesia: Spinal

## 2020-03-24 MED ORDER — ONDANSETRON HCL 4 MG/2ML IJ SOLN
INTRAMUSCULAR | Status: DC | PRN
  Administered 2020-03-24: 4 mg via INTRAVENOUS

## 2020-03-24 MED ORDER — PHENYLEPHRINE HCL (PRESSORS) 10 MG/ML IV SOLN
INTRAVENOUS | Status: DC | PRN
  Administered 2020-03-24: 80 ug via INTRAVENOUS

## 2020-03-24 MED ORDER — LIDOCAINE HCL (PF) 1 % IJ SOLN: 30.0000 mL | INTRAMUSCULAR | Status: DC | PRN

## 2020-03-24 MED ORDER — PENICILLIN G POT IN DEXTROSE 60000 UNIT/ML IV SOLN
3.0000 10*6.[IU] | INTRAVENOUS | Status: DC
  Administered 2020-03-24 (×2): 3 10*6.[IU] via INTRAVENOUS
  Filled 2020-03-24 (×2): qty 50

## 2020-03-24 MED ORDER — KETOROLAC TROMETHAMINE 30 MG/ML IJ SOLN: 30.0000 mg | Freq: Four times a day (QID) | INTRAMUSCULAR | Status: AC | PRN

## 2020-03-24 MED ORDER — NALOXONE HCL 0.4 MG/ML IJ SOLN: 0.4000 mg | INTRAMUSCULAR | Status: DC | PRN

## 2020-03-24 MED ORDER — LACTATED RINGERS IV SOLN: 500.0000 mL | INTRAVENOUS | Status: DC | PRN

## 2020-03-24 MED ORDER — ACETAMINOPHEN 500 MG PO TABS
1000.0000 mg | ORAL_TABLET | Freq: Four times a day (QID) | ORAL | Status: DC
  Administered 2020-03-25 – 2020-03-26 (×5): 1000 mg via ORAL
  Filled 2020-03-24 (×6): qty 2

## 2020-03-24 MED ORDER — FENTANYL CITRATE (PF) 100 MCG/2ML IJ SOLN
INTRAMUSCULAR | Status: AC
  Filled 2020-03-24: qty 2

## 2020-03-24 MED ORDER — MEPERIDINE HCL 25 MG/ML IJ SOLN: 6.2500 mg | INTRAMUSCULAR | Status: DC | PRN

## 2020-03-24 MED ORDER — OXYCODONE HCL 5 MG PO TABS: 5.0000 mg | ORAL_TABLET | ORAL | Status: DC | PRN

## 2020-03-24 MED ORDER — CEFAZOLIN SODIUM-DEXTROSE 2-4 GM/100ML-% IV SOLN
2.0000 g | Freq: Once | INTRAVENOUS | Status: DC
  Filled 2020-03-24: qty 100

## 2020-03-24 MED ORDER — SCOPOLAMINE 1 MG/3DAYS TD PT72: 1.0000 | MEDICATED_PATCH | Freq: Once | TRANSDERMAL | Status: DC

## 2020-03-24 MED ORDER — SIMETHICONE 80 MG PO CHEW
80.0000 mg | CHEWABLE_TABLET | ORAL | Status: DC
  Administered 2020-03-24: 80 mg via ORAL
  Filled 2020-03-24: qty 1

## 2020-03-24 MED ORDER — WITCH HAZEL-GLYCERIN EX PADS: 1.0000 "application " | MEDICATED_PAD | CUTANEOUS | Status: DC | PRN

## 2020-03-24 MED ORDER — MENTHOL 3 MG MT LOZG: 1.0000 | LOZENGE | OROMUCOSAL | Status: DC | PRN

## 2020-03-24 MED ORDER — PHENYLEPHRINE HCL-NACL 20-0.9 MG/250ML-% IV SOLN
INTRAVENOUS | Status: AC
  Filled 2020-03-24: qty 250

## 2020-03-24 MED ORDER — PRENATAL MULTIVITAMIN CH
1.0000 | ORAL_TABLET | Freq: Every day | ORAL | Status: DC
  Administered 2020-03-25: 1 via ORAL
  Filled 2020-03-24 (×2): qty 1

## 2020-03-24 MED ORDER — SENNOSIDES-DOCUSATE SODIUM 8.6-50 MG PO TABS
2.0000 | ORAL_TABLET | ORAL | Status: DC
  Administered 2020-03-24 – 2020-03-25 (×2): 2 via ORAL
  Filled 2020-03-24 (×2): qty 2

## 2020-03-24 MED ORDER — NALBUPHINE HCL 10 MG/ML IJ SOLN: 5.0000 mg | INTRAMUSCULAR | Status: DC | PRN

## 2020-03-24 MED ORDER — LACTATED RINGERS IV SOLN: INTRAVENOUS | Status: DC

## 2020-03-24 MED ORDER — OXYTOCIN-SODIUM CHLORIDE 30-0.9 UT/500ML-% IV SOLN
1.0000 m[IU]/min | INTRAVENOUS | Status: DC
  Administered 2020-03-24: 1 m[IU]/min via INTRAVENOUS
  Filled 2020-03-24: qty 500

## 2020-03-24 MED ORDER — MORPHINE SULFATE (PF) 0.5 MG/ML IJ SOLN
INTRAMUSCULAR | Status: AC
  Filled 2020-03-24: qty 10

## 2020-03-24 MED ORDER — KETOROLAC TROMETHAMINE 30 MG/ML IJ SOLN
30.0000 mg | Freq: Once | INTRAMUSCULAR | Status: AC
  Administered 2020-03-25: 30 mg via INTRAVENOUS

## 2020-03-24 MED ORDER — ACETAMINOPHEN 325 MG PO TABS: 650.0000 mg | ORAL_TABLET | ORAL | Status: DC | PRN

## 2020-03-24 MED ORDER — HYDROMORPHONE HCL 1 MG/ML IJ SOLN: 0.2000 mg | INTRAMUSCULAR | Status: DC | PRN

## 2020-03-24 MED ORDER — ONDANSETRON HCL 4 MG/2ML IJ SOLN
INTRAMUSCULAR | Status: AC
  Filled 2020-03-24: qty 2

## 2020-03-24 MED ORDER — OXYTOCIN BOLUS FROM INFUSION: 333.0000 mL | Freq: Once | INTRAVENOUS | Status: DC

## 2020-03-24 MED ORDER — HYDROMORPHONE HCL 1 MG/ML IJ SOLN: 0.2500 mg | INTRAMUSCULAR | Status: DC | PRN

## 2020-03-24 MED ORDER — ONDANSETRON HCL 4 MG/2ML IJ SOLN: 4.0000 mg | Freq: Three times a day (TID) | INTRAMUSCULAR | Status: DC | PRN

## 2020-03-24 MED ORDER — FENTANYL CITRATE (PF) 100 MCG/2ML IJ SOLN
INTRAMUSCULAR | Status: DC
Start: 2020-03-24 — End: 2020-03-24
  Filled 2020-03-24: qty 2

## 2020-03-24 MED ORDER — STERILE WATER FOR IRRIGATION IR SOLN
Status: DC | PRN
  Administered 2020-03-24: 1000 mL

## 2020-03-24 MED ORDER — OXYCODONE HCL 5 MG PO TABS: 5.0000 mg | ORAL_TABLET | Freq: Once | ORAL | Status: DC | PRN

## 2020-03-24 MED ORDER — BUPIVACAINE IN DEXTROSE 0.75-8.25 % IT SOLN
INTRATHECAL | Status: DC | PRN
  Administered 2020-03-24: 1.6 mL via INTRATHECAL

## 2020-03-24 MED ORDER — DIBUCAINE (PERIANAL) 1 % EX OINT: 1.0000 "application " | TOPICAL_OINTMENT | CUTANEOUS | Status: DC | PRN

## 2020-03-24 MED ORDER — OXYTOCIN-SODIUM CHLORIDE 30-0.9 UT/500ML-% IV SOLN
INTRAVENOUS | Status: AC
  Filled 2020-03-24: qty 500

## 2020-03-24 MED ORDER — KETOROLAC TROMETHAMINE 30 MG/ML IJ SOLN
INTRAMUSCULAR | Status: AC
  Filled 2020-03-24: qty 1

## 2020-03-24 MED ORDER — SIMETHICONE 80 MG PO CHEW: 80.0000 mg | CHEWABLE_TABLET | ORAL | Status: DC | PRN

## 2020-03-24 MED ORDER — BUTORPHANOL TARTRATE 1 MG/ML IJ SOLN
2.0000 mg | Freq: Once | INTRAMUSCULAR | Status: AC
  Administered 2020-03-24: 2 mg via INTRAVENOUS
  Filled 2020-03-24: qty 2

## 2020-03-24 MED ORDER — FENTANYL CITRATE (PF) 100 MCG/2ML IJ SOLN
100.0000 ug | INTRAMUSCULAR | Status: DC | PRN
Start: 2020-03-24 — End: 2020-03-24

## 2020-03-24 MED ORDER — NALBUPHINE HCL 10 MG/ML IJ SOLN
5.0000 mg | Freq: Once | INTRAMUSCULAR | Status: AC | PRN
  Administered 2020-03-24: 5 mg via SUBCUTANEOUS

## 2020-03-24 MED ORDER — CEFAZOLIN SODIUM-DEXTROSE 2-3 GM-%(50ML) IV SOLR
INTRAVENOUS | Status: DC | PRN
  Administered 2020-03-24: 2 g via INTRAVENOUS

## 2020-03-24 MED ORDER — OXYTOCIN-SODIUM CHLORIDE 30-0.9 UT/500ML-% IV SOLN: 2.5000 [IU]/h | INTRAVENOUS | Status: DC

## 2020-03-24 MED ORDER — PHENYLEPHRINE HCL-NACL 20-0.9 MG/250ML-% IV SOLN
INTRAVENOUS | Status: DC | PRN
  Administered 2020-03-24: 60 ug/min via INTRAVENOUS

## 2020-03-24 MED ORDER — SIMETHICONE 80 MG PO CHEW
80.0000 mg | CHEWABLE_TABLET | Freq: Three times a day (TID) | ORAL | Status: DC
  Administered 2020-03-25 – 2020-03-26 (×4): 80 mg via ORAL
  Filled 2020-03-24 (×4): qty 1

## 2020-03-24 MED ORDER — LACTATED RINGERS IV SOLN: INTRAVENOUS | Status: DC | PRN

## 2020-03-24 MED ORDER — OXYTOCIN-SODIUM CHLORIDE 30-0.9 UT/500ML-% IV SOLN
INTRAVENOUS | Status: DC | PRN
  Administered 2020-03-24: 100 mL via INTRAVENOUS
  Administered 2020-03-24: 200 mL via INTRAVENOUS

## 2020-03-24 MED ORDER — NALBUPHINE HCL 10 MG/ML IJ SOLN
INTRAMUSCULAR | Status: AC
  Filled 2020-03-24: qty 1

## 2020-03-24 MED ORDER — DIPHENHYDRAMINE HCL 50 MG/ML IJ SOLN
12.5000 mg | INTRAMUSCULAR | Status: DC | PRN
  Administered 2020-03-25: 12.5 mg via INTRAVENOUS
  Filled 2020-03-24: qty 1

## 2020-03-24 MED ORDER — OXYTOCIN-SODIUM CHLORIDE 30-0.9 UT/500ML-% IV SOLN: 2.5000 [IU]/h | INTRAVENOUS | Status: AC

## 2020-03-24 MED ORDER — KETOROLAC TROMETHAMINE 30 MG/ML IJ SOLN
30.0000 mg | Freq: Four times a day (QID) | INTRAMUSCULAR | Status: AC | PRN
  Administered 2020-03-24: 30 mg via INTRAMUSCULAR

## 2020-03-24 MED ORDER — SOD CITRATE-CITRIC ACID 500-334 MG/5ML PO SOLN
30.0000 mL | ORAL | Status: DC | PRN
  Administered 2020-03-24: 30 mL via ORAL
  Filled 2020-03-24: qty 15

## 2020-03-24 MED ORDER — KETOROLAC TROMETHAMINE 30 MG/ML IJ SOLN
30.0000 mg | Freq: Four times a day (QID) | INTRAMUSCULAR | Status: AC
  Administered 2020-03-25 (×2): 30 mg via INTRAVENOUS
  Filled 2020-03-24 (×3): qty 1

## 2020-03-24 MED ORDER — NALBUPHINE HCL 10 MG/ML IJ SOLN
5.0000 mg | INTRAMUSCULAR | Status: DC | PRN
  Administered 2020-03-25 (×2): 5 mg via INTRAVENOUS
  Filled 2020-03-24 (×2): qty 1

## 2020-03-24 MED ORDER — COCONUT OIL OIL: 1.0000 "application " | TOPICAL_OIL | Status: DC | PRN

## 2020-03-24 MED ORDER — ONDANSETRON HCL 4 MG/2ML IJ SOLN: 4.0000 mg | Freq: Four times a day (QID) | INTRAMUSCULAR | Status: DC | PRN

## 2020-03-24 MED ORDER — SODIUM CHLORIDE 0.9% FLUSH: 3.0000 mL | INTRAVENOUS | Status: DC | PRN

## 2020-03-24 MED ORDER — NALBUPHINE HCL 10 MG/ML IJ SOLN: 5.0000 mg | Freq: Once | INTRAMUSCULAR | Status: AC | PRN

## 2020-03-24 MED ORDER — TETANUS-DIPHTH-ACELL PERTUSSIS 5-2.5-18.5 LF-MCG/0.5 IM SUSP: 0.5000 mL | Freq: Once | INTRAMUSCULAR | Status: DC

## 2020-03-24 MED ORDER — NALOXONE HCL 4 MG/10ML IJ SOLN
1.0000 ug/kg/h | INTRAVENOUS | Status: DC | PRN
  Filled 2020-03-24: qty 5

## 2020-03-24 MED ORDER — IBUPROFEN 800 MG PO TABS
800.0000 mg | ORAL_TABLET | Freq: Four times a day (QID) | ORAL | Status: DC
  Administered 2020-03-25 – 2020-03-26 (×4): 800 mg via ORAL
  Filled 2020-03-24 (×4): qty 1

## 2020-03-24 MED ORDER — DIPHENHYDRAMINE HCL 25 MG PO CAPS: 25.0000 mg | ORAL_CAPSULE | ORAL | Status: DC | PRN

## 2020-03-24 MED ORDER — OXYCODONE HCL 5 MG/5ML PO SOLN: 5.0000 mg | Freq: Once | ORAL | Status: DC | PRN

## 2020-03-24 MED ORDER — SODIUM CHLORIDE 0.9 % IR SOLN
Status: DC | PRN
  Administered 2020-03-24: 1000 mL

## 2020-03-24 MED ORDER — DIPHENHYDRAMINE HCL 25 MG PO CAPS: 25.0000 mg | ORAL_CAPSULE | Freq: Four times a day (QID) | ORAL | Status: DC | PRN

## 2020-03-24 MED ORDER — PROMETHAZINE HCL 25 MG/ML IJ SOLN
12.5000 mg | Freq: Once | INTRAMUSCULAR | Status: AC
  Administered 2020-03-24: 12.5 mg via INTRAVENOUS
  Filled 2020-03-24: qty 1

## 2020-03-24 MED ORDER — MORPHINE SULFATE (PF) 0.5 MG/ML IJ SOLN
INTRAMUSCULAR | Status: DC | PRN
Start: 2020-03-24 — End: 2020-03-24
  Administered 2020-03-24: .15 mg via INTRATHECAL

## 2020-03-24 MED ORDER — TERBUTALINE SULFATE 1 MG/ML IJ SOLN: 0.2500 mg | Freq: Once | INTRAMUSCULAR | Status: DC | PRN

## 2020-03-24 MED ORDER — SODIUM CHLORIDE 0.9 % IV SOLN
500.0000 mg | Freq: Once | INTRAVENOUS | Status: AC
  Administered 2020-03-24: 500 mg via INTRAVENOUS
  Filled 2020-03-24: qty 500

## 2020-03-24 MED ORDER — ONDANSETRON HCL 4 MG/2ML IJ SOLN: 4.0000 mg | Freq: Once | INTRAMUSCULAR | Status: DC | PRN

## 2020-03-24 MED ORDER — SODIUM CHLORIDE 0.9 % IV SOLN
5.0000 10*6.[IU] | Freq: Once | INTRAVENOUS | Status: AC
  Administered 2020-03-24: 5 10*6.[IU] via INTRAVENOUS
  Filled 2020-03-24: qty 5

## 2020-03-24 SURGICAL SUPPLY — 36 items
APL PRP STRL LF DISP 70% ISPRP (MISCELLANEOUS) ×1
APL SKNCLS STERI-STRIP NONHPOA (GAUZE/BANDAGES/DRESSINGS) ×1
BENZOIN TINCTURE PRP APPL 2/3 (GAUZE/BANDAGES/DRESSINGS) ×1 IMPLANT
CHLORAPREP W/TINT 26 (MISCELLANEOUS) ×2 IMPLANT
CLAMP CORD UMBIL (MISCELLANEOUS) ×2 IMPLANT
CLOTH BEACON ORANGE TIMEOUT ST (SAFETY) ×2 IMPLANT
DRSG OPSITE POSTOP 4X10 (GAUZE/BANDAGES/DRESSINGS) ×2 IMPLANT
ELECT REM PT RETURN 9FT ADLT (ELECTROSURGICAL) ×2
ELECTRODE REM PT RTRN 9FT ADLT (ELECTROSURGICAL) ×1 IMPLANT
EXTRACTOR VACUUM KIWI (MISCELLANEOUS) IMPLANT
EXTRACTOR VACUUM M CUP 4 TUBE (SUCTIONS) IMPLANT
GLOVE BIO SURGEON STRL SZ7.5 (GLOVE) ×2 IMPLANT
GLOVE BIOGEL PI IND STRL 7.0 (GLOVE) ×2 IMPLANT
GLOVE BIOGEL PI INDICATOR 7.0 (GLOVE) ×2
GOWN STRL REUS W/TWL 2XL LVL3 (GOWN DISPOSABLE) ×2 IMPLANT
GOWN STRL REUS W/TWL LRG LVL3 (GOWN DISPOSABLE) ×4 IMPLANT
HEMOSTAT ARISTA ABSORB 3G PWDR (HEMOSTASIS) IMPLANT
KIT ABG SYR 3ML LUER SLIP (SYRINGE) IMPLANT
NDL HYPO 25X5/8 SAFETYGLIDE (NEEDLE) IMPLANT
NEEDLE HYPO 25X5/8 SAFETYGLIDE (NEEDLE) IMPLANT
NS IRRIG 1000ML POUR BTL (IV SOLUTION) ×2 IMPLANT
PACK C SECTION WH (CUSTOM PROCEDURE TRAY) ×2 IMPLANT
PAD OB MATERNITY 4.3X12.25 (PERSONAL CARE ITEMS) ×2 IMPLANT
RTRCTR C-SECT PINK 25CM LRG (MISCELLANEOUS) ×2 IMPLANT
STRIP CLOSURE SKIN 1/2X4 (GAUZE/BANDAGES/DRESSINGS) ×1 IMPLANT
SUT PDS AB 0 CTX 36 PDP370T (SUTURE) IMPLANT
SUT PLAIN 0 NONE (SUTURE) IMPLANT
SUT PLAIN 2 0 (SUTURE) ×2
SUT PLAIN ABS 2-0 CT1 27XMFL (SUTURE) IMPLANT
SUT VIC AB 0 CT1 36 (SUTURE) ×5 IMPLANT
SUT VIC AB 2-0 CT1 27 (SUTURE) ×4
SUT VIC AB 2-0 CT1 TAPERPNT 27 (SUTURE) ×2 IMPLANT
SUT VIC AB 4-0 KS 27 (SUTURE) ×2 IMPLANT
TOWEL OR 17X24 6PK STRL BLUE (TOWEL DISPOSABLE) ×4 IMPLANT
TRAY FOLEY W/BAG SLVR 14FR LF (SET/KITS/TRAYS/PACK) IMPLANT
WATER STERILE IRR 1000ML POUR (IV SOLUTION) ×2 IMPLANT

## 2020-03-24 NOTE — H&P (Addendum)
OBSTETRIC ADMISSION HISTORY AND PHYSICAL  Janice [facility] is a 39 y.o. female [phone] with IUP at [redacted]w[redacted]d by L/6 presenting for SROM for blood-tinged fluid at 0430 on 03/24/20. She reports +FMs, no VB. She plans on breast feeding. She does not want to be on post partum birth control.  She received her prenatal care at [facility]  Dating: By L/6 --->  Estimated Date of Delivery: 04/08/20  Sono:  @[redacted]w[redacted]d , CWD, normal anatomy, cephalic presentation, 229N, 55% EFW  Prenatal History/Complications: hyperthyroidism (no medications), h/o Cesarean in 2012 secondary to non-reassuring fetal heart tones (desire for TOLAC), AMA (declined genetic testing), GBS positive status.  Past Medical History: Past Medical History:  Diagnosis Date   Depressed    Fibroid    noted on [place] 07/2019   Hyperthyroidism     Past Surgical History: Past Surgical History:  Procedure Laterality Date   CESAREAN SECTION     DILATION AND EVACUATION  02/06/2011   Procedure: DILATATION AND EVACUATION (D&E);  Surgeon: Emeterio Reeve, MD;  Location: [facility];  Service: Gynecology;  Laterality: N/A;   DILATION AND EVACUATION  02/22/2011   Procedure: DILATATION AND EVACUATION (D&E);  Surgeon: Emeterio Reeve, MD;  Location: [facility];  Service: Gynecology;  Laterality: N/A;    Obstetrical History: OB History     Gravida  4   Para  1   Term  1   Preterm  0   AB  2   Living  1      SAB  2   TAB  0   Ectopic  0   Multiple  0   Live Births  1           Social History Social History   Socioeconomic History   Marital status: Married    Spouse name: Not on file   Number of children: Not on file   Years of education: Not on file   Highest education level: Not on file  Occupational History   Not on file  Tobacco Use   Smoking status: Never Smoker   Smokeless tobacco: Never Used  Vaping Use   Vaping Use: Never used  Substance and Sexual Activity   Alcohol use: Not Currently    Alcohol/week: 3.0 standard drinks     Types: 3 Cans of beer per week    Comment: Social   Drug use: No   Sexual activity: Yes    Birth control/protection: None    Comment: was using " next day" pills 2-3 times per month   Other Topics Concern   Not on file  Social History Narrative   Not on file   Social Determinants of Health   Financial Resource Strain:    Difficulty of Paying Living Expenses: Not on file  Food Insecurity: No Food Insecurity   Worried About Running Out of Food in the Last Year: Never true   Ran Out of Food in the Last Year: Never true  Transportation Needs: No Transportation Needs   Lack of Transportation (Medical): No   Lack of Transportation (Non-Medical): No  Physical Activity:    Days of Exercise per Week: Not on file   Minutes of Exercise per Session: Not on file  Stress:    Feeling of Stress : Not on file  Social Connections:    Frequency of Communication with Friends and Family: Not on file   Frequency of Social Gatherings with Friends and Family: Not on file   Attends Religious Services: Not on file  Active Member of Clubs or Organizations: Not on file   Attends Archivist Meetings: Not on file   Marital Status: Not on file    Family History: Family History  Problem Relation Age of Onset   Diabetes Paternal Uncle    Cancer Father    Throat cancer Father    Anesthesia problems Neg Hx    Other Neg Hx     Allergies: Allergies  Allergen Reactions   Dilaudid [Hydromorphone Hcl] Itching and Rash    Pt states she is allergic to an IV pain medication but unable to recall name of drug   Fentanyl Itching and Rash    Pt states she is allergic to an IV pain medication but unable to recall name of drug   Morphine And Related Itching and Rash    Pt states she is allergic to an IV pain medication but unable to recall name of drug    Medications Prior to Admission  Medication Sig Dispense Refill Last Dose   betamethasone dipropionate 0.05 % cream Apply topically 2 (two)  times daily. 45 g 0    Cetirizine HCl (ZYRTEC ALLERGY PO) Take 1 tablet by mouth daily. (Patient not taking: Reported on 03/18/2020)      Elastic Bandages & Supports (CARPAL TUNNEL WRIST STABILIZER) MISC 2 Devices by Does not apply route as needed. 2 each 1    hydrOXYzine (VISTARIL) 25 MG capsule Take 1 capsule (25 mg total) by mouth 3 (three) times daily as needed. 30 capsule 0    Magnesium Oxide 500 MG CAPS Take 1 capsule (500 mg total) by mouth at bedtime. 60 capsule 1    Omega-3 Fatty Acids (FISH OIL) 1000 MG CAPS Take by mouth.      Prenatal Vit-Fe Fumarate-FA (PRENATAL VITAMINS PO) Take 1 tablet by mouth daily.      triamcinolone cream (KENALOG) 0.5 % Apply 1 application topically 2 (two) times daily. 30 g 0      Review of Systems   All systems reviewed and negative except as stated in HPI  Blood pressure 125/90, pulse 80, temperature (!) 97.5 F (36.4 C), resp. rate 18, height 5' (1.524 m), weight 65.7 kg, last menstrual period 07/03/2019. General appearance: alert, cooperative and appears stated age Lungs: clear to auscultation bilaterally Abdomen: soft, non-tender; bowel sounds normal Presentation: cephalic by ultrasound Fetal monitoringBaseline: 125 bpm, Variability: Good {> 6 bpm), Accelerations: Reactive and Decelerations: Absent Uterine activity: q5 mins Dilation: 1 Effacement (%): 50 Station: Ballotable Exam by:: Hansel Feinstein CNM   Prenatal labs: ABO, Rh: O/Positive/-- (05/12 1008) Antibody: Negative (05/12 1008) Rubella: 4.39 (05/12 1008) RPR: Non Reactive (07/07 0839)  HBsAg: Negative (05/12 1008)  HIV: Non Reactive (07/07 0839)  GBS: Positive/-- (09/24 1046)  1 hr Glucola wnl Genetic screening declined Anatomy US wnl  Prenatal Transfer Tool  Maternal Diabetes: No Genetic Screening: Declined Maternal Ultrasounds/Referrals: Normal Fetal Ultrasounds or other Referrals:  None Maternal Substance Abuse:  No Significant Maternal Medications:   None Significant Maternal Lab Results: Group B Strep positive  No results found for this or any previous visit (from the past 24 hour(s)).  Patient Active Problem List   Diagnosis Date Noted   Group B Streptococcus carrier, +RV culture, currently pregnant 03/21/2020   Carpal tunnel syndrome during pregnancy 02/11/2020   Supervision of high risk pregnancy, antepartum 10/19/2019   AMA (advanced maternal age) multigravida 35+ 10/19/2019   History of gestational diabetes mellitus (GDM) 10/19/2019   Fibroid    Hyperthyroidism  History of cesarean section     Assessment/Plan:  Janice [facility] is a 39 y.o. G4P1021 at [redacted]w[redacted]d here for labor management s/p PROM (03/24/20 at 0430).  #PROM: Cervix still 1/high, although having regular contractions. Patient declines labor augmentation at this time. Risks/benefits/alternatives to augmentation discussed.  #Pain: per request; allergic to dilaudid, fentanyl, morphine #FWB: Cat 1 strip #ID: GBS positive--start PCN on admission #MOF: breast #MOC: declines #AMA: pt declined genetic screening. Anatomy Scan wnl. #Hyperthyroidism: no medications in pregnancy. TFTs wnl on 11/04/19. No testing for antibodies in prenatal period. Plan for repeat TFTs 4-6 weeks postpartum. #H/o Cesarean: prior Cesarean secondary to non-reassuring fetal heart tones in 2012. Pt desires TOLAC. Counseled on risk on admission.  Talitha Givens MD, PGY1  Attestation of Supervision of Student:  I confirm that I have verified the information documented in the  resident's  note and that I have also personally reperformed the history, physical exam and all medical decision making activities.  I have verified that all services and findings are accurately documented in this student's note; and I agree with management and plan as outlined in the documentation. I have also made any necessary editorial changes.  Randa Ngo, [place] for [facility], [place]  [facility] 03/24/2020 8:39 AM

## 2020-03-24 NOTE — Anesthesia Preprocedure Evaluation (Signed)
Anesthesia Evaluation  Patient identified by MRN, date of birth, ID band Patient awake    Reviewed: Allergy & Precautions, H&P , NPO status , Patient's Chart, lab work & pertinent test results  History of Anesthesia Complications Negative for: history of anesthetic complications  Airway Mallampati: II  TM Distance: >3 FB Neck ROM: full    Dental no notable dental hx.    Pulmonary neg pulmonary ROS,    Pulmonary exam normal        Cardiovascular negative cardio ROS Normal cardiovascular exam Rhythm:regular Rate:Normal     Neuro/Psych Depression negative neurological ROS     GI/Hepatic negative GI ROS, Neg liver ROS,   Endo/Other  Hyperthyroidism   Renal/GU negative Renal ROS  negative genitourinary   Musculoskeletal   Abdominal   Peds  Hematology negative hematology ROS (+)   Anesthesia Other Findings 1 prior C/S  Reproductive/Obstetrics (+) Pregnancy                             Anesthesia Physical Anesthesia Plan  ASA: II  Anesthesia Plan: Spinal   Post-op Pain Management:    Induction:   PONV Risk Score and Plan: 3 and Ondansetron and Treatment may vary due to age or medical condition  Airway Management Planned: Natural Airway  Additional Equipment: None  Intra-op Plan:   Post-operative Plan:   Informed Consent: I have reviewed the patients History and Physical, chart, labs and discussed the procedure including the risks, benefits and alternatives for the proposed anesthesia with the patient or authorized representative who has indicated his/her understanding and acceptance.       Plan Discussed with:   Anesthesia Plan Comments:         Anesthesia Quick Evaluation

## 2020-03-24 NOTE — Progress Notes (Signed)
Janice [facility] CNM in to do spec exam. Cervix has swollen area about 12n on cervix.

## 2020-03-24 NOTE — Transfer of Care (Signed)
Immediate Anesthesia Transfer of Care Note  Patient: Janice [facility]  Procedure(s) Performed: CESAREAN SECTION  Patient Location: PACU  Anesthesia Type:Spinal  Level of Consciousness: awake, alert  and oriented  Airway & Oxygen Therapy: Patient Spontanous Breathing  Post-op Assessment: Report given to RN and Post -op Vital signs reviewed and stable  Post vital signs: Reviewed and stable  Last Vitals:  Vitals Value Taken Time  BP 106/75 03/24/20 2117  Temp    Pulse 91 03/24/20 2120  Resp 15 03/24/20 2120  SpO2 98 % 03/24/20 2120  Vitals shown include unvalidated device data.  Last Pain:  Vitals:   03/24/20 1840  TempSrc: Oral  PainSc: 0-No pain      Patients Stated Pain Goal: 0 (30/14/84 0397)  Complications: No complications documented.

## 2020-03-24 NOTE — Anesthesia Procedure Notes (Signed)
Spinal  Patient location during procedure: OR Staffing Performed: anesthesiologist  Anesthesiologist: Adilynn Bessey E, MD Preanesthetic Checklist Completed: patient identified, IV checked, risks and benefits discussed, surgical consent, monitors and equipment checked, pre-op evaluation and timeout performed Spinal Block Patient position: sitting Prep: DuraPrep and site prepped and draped Patient monitoring: continuous pulse ox, blood pressure and heart rate Approach: midline Location: L3-4 Injection technique: single-shot Needle Needle type: Pencan  Needle gauge: 24 G Needle length: 9 cm Additional Notes Functioning IV was confirmed and monitors were applied. Sterile prep and drape, including hand hygiene and sterile gloves were used. The patient was positioned and the spine was prepped. The skin was anesthetized with lidocaine.  Free flow of clear CSF was obtained prior to injecting local anesthetic into the CSF. The needle was carefully withdrawn. The patient tolerated the procedure well.      

## 2020-03-24 NOTE — Progress Notes (Signed)
FB out, SVE still 2/50/ballotable. Unable to feel presenting part, so BSUS was done. Vertex/Oblique presentation, head to maternal right.  Patient reports she doesn't want to have anymore vaginal exam because they are too painful. She is requesting repeat Elective Cesarean delivery.  The risks of cesarean section were discussed with the patient including but were not limited to: bleeding which may require transfusion or reoperation; infection which may require antibiotics; injury to bowel, bladder, ureters or other surrounding organs; injury to the fetus; need for additional procedures including hysterectomy in the event of a life-threatening hemorrhage; placental abnormalities wth subsequent pregnancies, incisional problems, thromboembolic phenomenon and other postoperative/anesthesia complications.  The patient concurred with the proposed plan, giving informed written consent for the procedures.  Patient has been NPO since 4 PM yesterday, she will remain NPO for procedure. Anesthesia and OR aware.  Preoperative prophylactic antibiotics and SCDs ordered on call to the OR.  To OR when ready.  Merilyn Baba, DO OB Fellow, Faculty Practice 03/24/2020 5:24 PM

## 2020-03-24 NOTE — Progress Notes (Signed)
Dr Domenick Gong notified of pt's admission and status. Aware of srom with pink fld, ctx pattern, sve exam with swollen area of cervix, positive GBS, elevated b/p, reactive FHR. Will admit to [facility]

## 2020-03-24 NOTE — MAU Note (Signed)
My water broke about 53mins ago. The color of fld is like water. Having contractions. Desires TOLAC. Cervix closed last sve 2 wks ago

## 2020-03-24 NOTE — Progress Notes (Signed)
Janice [facility] is a 39 y.o. B2I2035 at [redacted]w[redacted]d admitted for PROM (0430 on 03/24/20)  Subjective: Had a good nap. Spanish interpreter used.  Objective: BP 129/90   Pulse 88   Temp 98.9 F (37.2 C) (Oral)   Resp 16   Ht 5' (1.524 m)   Wt 65.7 kg   LMP 07/03/2019   SpO2 100%   BMI 28.30 kg/m  No intake/output data recorded.  FHT:  FHR: 145 bpm, variability: moderate,  accelerations:  Present,  decelerations:  Absent UC:   irregular  SVE:   Dilation: 2 Effacement (%): Thick Station: Ballotable Exam by:: Williams MD   Labs: Lab Results  Component Value Date   WBC 7.2 03/24/2020   HGB 12.2 03/24/2020   HCT 36.2 03/24/2020   MCV 88.9 03/24/2020   PLT 140 (L) 03/24/2020    Assessment / Plan: 39 yo G4P1021 at [redacted]w[redacted]d here for PROM (0430 on 03/24/20)  #Labor: SROM at [place] on 03/24/20. Discussed with patient FB placement and low dose pitocin to initiate labor. FB placed by Dr. Jimmye Norman at this exam. #Cervical Polyp: picture taken with permission, and in patient's chart. Will leave for now. Consider management once patient has an epidural. #Pain: per patient request. Reports allergy to Fentanyl #FWB: Cat 1 strip #ID: GBS positive; on PCN  #AMA: pt declined genetic screening. Anatomy Scan wnl. #Hyperthyroidism: no medications in pregnancy. TFTs wnl on 11/04/19. No testing for antibodies in prenatal period. Plan for repeat TFTs 4-6 weeks postpartum. #H/o Cesarean: prior Cesarean secondary to non-reassuring fetal heart tones in 2012. Pt desires TOLAC. Counseled on risk on admission.  Merilyn Baba DO OB Fellow, Faculty Practice 03/24/2020, 12:37 PM

## 2020-03-24 NOTE — Discharge Summary (Signed)
Postpartum Discharge Summary  Patient Name: Janice [facility] DOB: Aug 30, 1980 MRN: [id number]  Date of admission: 03/24/2020 Delivery date:03/24/2020  Delivering provider: Juanna Cao T  Date of discharge: 03/26/2020  Admitting diagnosis: Premature rupture of membranes [O42.90] Intrauterine pregnancy: [redacted]w[redacted]d     Secondary diagnosis:  Active Problems:   AMA (advanced maternal age) multigravida 35+   History of gestational diabetes mellitus (GDM)   Hyperthyroidism   History of cesarean section   Group B Streptococcus carrier, +RV culture, currently pregnant   Premature rupture of membranes   Cervical polyp   Cesarean delivery delivered  Additional problems: none    Discharge diagnosis: Term Pregnancy Delivered                                              Post partum procedures:none Augmentation: Pitocin and IP Foley Complications: None  Hospital course: Onset of Labor With Unplanned C/S   39 y.o. yo [id number] at [redacted]w[redacted]d was admitted in Latent Labor on 03/24/2020. Patient had a labor course significant for arrest of dilation and malpresentation. The patient went for cesarean section due to Malpresentation and Arrest of Dilation. Patient initially presented for PROM and desired TOLAC, was cephalic at that time. Foley bulb was placed and patient was initiated on pitocin. After FB came out patient still only 2cm, presenting part was no longer palpated on exam and BSUS revealed vertex/olblique presentation. Patient also had what was noted to be a cervical polyp on exam, distorting the anatomy of her cervix. Decision was made to bring patient for repeat cesarean section. Delivery details as follows: Membrane Rupture Time/Date: 4:30 AM ,03/24/2020   Delivery Method:C-Section, Low Transverse  Details of operation can be found in separate operative note. Patient had an uncomplicated postpartum course.  She is ambulating,tolerating a regular diet, passing flatus, and urinating well.  Patient is  discharged home in stable condition 03/26/20.  Newborn Data: Birth date:03/24/2020  Birth time:8:24 PM  Gender:Female  Living status:Living  Apgars:8 ,9  Weight:3059 g   Magnesium Sulfate received: No BMZ received: No Rhophylac:N/A MMR:N/A T-DaP:Given prenatally Flu: No Transfusion:No  Physical exam  Vitals:   03/25/20 1512 03/25/20 1826 03/25/20 2013 03/26/20 0519  BP: 138/78 131/82 125/73 107/76  Pulse: 61 67 96 61  Resp:  $Remo'18 15 16  'VfOke$ Temp: 98.4 F (36.9 C) 98.6 F (37 C) 98 F (36.7 C) 97.8 F (36.6 C)  TempSrc: Oral Oral Oral Oral  SpO2: 99% 100% 95%   Weight:      Height:       General: alert, cooperative and no distress Lochia: appropriate Uterine Fundus: firm Incision: Healing well with no significant drainage, No significant erythema, Dressing is clean, dry, and intact DVT Evaluation: No evidence of DVT seen on physical exam. No cords or calf tenderness. No significant calf/ankle edema. Labs: Lab Results  Component Value Date   WBC 8.9 03/25/2020   HGB 10.5 (L) 03/25/2020   HCT 31.3 (L) 03/25/2020   MCV 89.2 03/25/2020   PLT 131 (L) 03/25/2020   CMP Latest Ref Rng & Units 07/04/2011  Glucose 70 - 99 mg/dL 106(H)  BUN 6 - 23 mg/dL 7  Creatinine 0.50 - 1.10 mg/dL 0.70  Sodium 135 - 145 mEq/L 139  Potassium 3.5 - 5.1 mEq/L 3.9  Chloride 96 - 112 mEq/L 105  CO2 19 - 32 mEq/L -  Calcium  8.4 - 10.5 mg/dL -  Total Protein 6.0 - 8.3 g/dL -  Total Bilirubin 0.3 - 1.2 mg/dL -  Alkaline Phos 39 - 117 U/L -  AST 0 - 37 U/L -  ALT 0 - 35 U/L -   Edinburgh Score: Edinburgh Postnatal Depression Scale Screening Tool 03/24/2020  I have been able to laugh and see the funny side of things. 1  I have looked forward with enjoyment to things. 0  I have blamed myself unnecessarily when things went wrong. 2  I have been anxious or worried for no good reason. 1  I have felt scared or panicky for no good reason. 2  Things have been getting on top of me. 0  I have been  so unhappy that I have had difficulty sleeping. 0  I have felt sad or miserable. 0  I have been so unhappy that I have been crying. 0  The thought of harming myself has occurred to me. 0  Edinburgh Postnatal Depression Scale Total 6     After visit meds:  Allergies as of 03/26/2020      Reactions   Dilaudid [hydromorphone Hcl] Itching, Rash   Pt states she is allergic to an IV pain medication but unable to recall name of drug - reaction from 01/2011, only documented pain med administered during that time was fentanyl   Fentanyl Itching, Rash   Pt states she is allergic to an IV pain medication but unable to recall name of drug. - reaction from 01/2011, only documented pain med administered during that time was fentanyl   Morphine And Related Itching, Rash   Pt states she is allergic to an IV pain medication but unable to recall name of drug - reaction from 01/2011, only documented pain med administered during that time was fentanyl      Medication List    STOP taking these medications   betamethasone dipropionate 0.05 % cream   Carpal Tunnel Wrist Stabilizer Misc   hydrOXYzine 25 MG capsule Commonly known as: Vistaril   Magnesium Oxide 500 MG Caps   triamcinolone cream 0.5 % Commonly known as: KENALOG     TAKE these medications   acetaminophen 500 MG tablet Commonly known as: TYLENOL Take 2 tablets (1,000 mg total) by mouth every 8 (eight) hours as needed.   coconut oil Oil Apply 1 application topically as needed (nipple pain).   ibuprofen 800 MG tablet Commonly known as: ADVIL Take 1 tablet (800 mg total) by mouth every 8 (eight) hours as needed.   PRENATAL VITAMINS PO Take 1 tablet by mouth daily.            Discharge Care Instructions  (From admission, onward)         Start     Ordered   03/26/20 0000  Leave dressing on - Keep it clean, dry, and intact until clinic visit        03/26/20 0909           Discharge home in stable condition Infant  Feeding: Breast Infant Disposition:home with mother Discharge instruction: per After Visit Summary and Postpartum booklet. Activity: Advance as tolerated. Pelvic rest for 6 weeks.  Diet: routine diet Future Appointments: No future appointments. Follow up Visit:  Message sent to [facility] clinic to schedule incision check & PP appt  Please schedule this patient for a In person postpartum visit in 4 weeks with the following provider: Any provider. Please check thyroid and evaluate cervical polyp at postpartum.  Additional Postpartum F/U:Incision check 1 week  High risk pregnancy complicated by: hyperthyroidism Delivery mode:  C-Section, Low Transverse  Anticipated Birth Control:  Unsure  03/26/2020 Randa Ngo, MD

## 2020-03-24 NOTE — Progress Notes (Signed)
To BS via w/c

## 2020-03-24 NOTE — Discharge Instructions (Signed)

## 2020-03-24 NOTE — Op Note (Signed)
Janice Bernard PROCEDURE DATE: 03/24/2020  PREOPERATIVE DIAGNOSIS: Intrauterine pregnancy at  [redacted]w[redacted]d weeks gestation; failure to progress: arrest of dilation, malpresentation: vertex/oblique and cervical polyp distorting cervical anatomy  POSTOPERATIVE DIAGNOSIS: The same  PROCEDURE: repeat Low Transverse Cesarean Section  SURGEON:  Dr. Juanna Cao  ASSISTANT: Dr. Corliss Blacker  INDICATIONS: Janice Bernard is a 39 y.o. [id number] at [redacted]w[redacted]d scheduled for cesarean section secondary to failure to progress: arrest of dilation, malpresentation: vertex/oblique and cervical polype distorting cervical anatomy.  The risks of cesarean section discussed with the patient included but were not limited to: bleeding which may require transfusion or reoperation; infection which may require antibiotics; injury to bowel, bladder, ureters or other surrounding organs; injury to the fetus; need for additional procedures including hysterectomy in the event of a life-threatening hemorrhage; placental abnormalities wth subsequent pregnancies, incisional problems, thromboembolic phenomenon and other postoperative/anesthesia complications. The patient concurred with the proposed plan, giving informed written consent for the procedure.    FINDINGS:  Viable female infant in LOA presentation.  Apgars pending, weight 3059g.  Minimal clear amniotic fluid, patient previously ruptured.  Intact placenta, three vessel cord.  Normal uterus, fallopian tubes and ovaries bilaterally.  ANESTHESIA:    Spinal INTRAVENOUS FLUIDS:1000 ml ESTIMATED BLOOD LOSS: 624 ml URINE OUTPUT:  200 ml SPECIMENS: Placenta sent to L&D COMPLICATIONS: None immediate  PROCEDURE IN DETAIL:  The patient received intravenous antibiotics (ancef and azithromycin) and had sequential compression devices applied to her lower extremities while in the preoperative area.  She was then taken to the operating room where spinal anesthesia was administered and  was found to be adequate. She was then placed in a dorsal supine position with a leftward tilt, and prepped and draped in a sterile manner.  A foley catheter was placed into her bladder and attached to constant gravity, which drained clear fluid throughout.  After an adequate timeout was performed, a Pfannenstiel skin incision was made with scalpel and carried through to the underlying layer of fascia. The fascia was incised in the midline and this incision was extended bilaterally using the Mayo scissors. Kocher clamps were applied to the superior aspect of the fascial incision and the underlying rectus muscles were dissected off bluntly. The rectus muscles were separated in the midline bluntly and the peritoneum was entered bluntly. An Alexis retractor was placed to aid in visualization of the uterus.  Attention was turned to the lower uterine segment where a transverse hysterotomy was made with a scalpel and extended bilaterally bluntly. The infant was successfully delivered, and cord was clamped and cut and infant was handed over to awaiting neonatology team. Uterine massage was then administered and the placenta delivered intact with three-vessel cord. The uterus was then cleared of clot and debris.  The hysterotomy was closed with 0 Vicryl in a running locked fashion, and an imbricating layer was also placed with a 0 Vicryl. Overall, excellent hemostasis was noted. The abdomen and the pelvis were cleared of all clot and debris and the Ubaldo Glassing was removed. Hemostasis was confirmed on all surfaces.  The peritoneum was reapproximated using 2-0 vicryl running stitches. The fascia was then closed using 0 Vicryl in a running fashion. The subcutaneous layer was reapproximated with 0-Vicryl and the skin was closed with 4-0 vicryl. The patient tolerated the procedure well. Sponge, lap, instrument and needle counts were correct x 2. She was taken to the recovery room in stable condition.    Arrie Senate,  MD 10/01/1446 10:30 PM

## 2020-03-25 ENCOUNTER — Encounter (HOSPITAL_COMMUNITY): Payer: Self-pay | Admitting: Obstetrics & Gynecology

## 2020-03-25 ENCOUNTER — Encounter: Payer: Self-pay | Admitting: Family Medicine

## 2020-03-25 DIAGNOSIS — E039 Hypothyroidism, unspecified: Secondary | ICD-10-CM

## 2020-03-25 DIAGNOSIS — O99285 Endocrine, nutritional and metabolic diseases complicating the puerperium: Secondary | ICD-10-CM

## 2020-03-25 DIAGNOSIS — O24439 Gestational diabetes mellitus in the puerperium, unspecified control: Secondary | ICD-10-CM

## 2020-03-25 LAB — CBC
HCT: 31.3 % — ABNORMAL LOW (ref 36.0–46.0)
Hemoglobin: 10.5 g/dL — ABNORMAL LOW (ref 12.0–15.0)
MCH: 29.9 pg (ref 26.0–34.0)
MCHC: 33.5 g/dL (ref 30.0–36.0)
MCV: 89.2 fL (ref 80.0–100.0)
Platelets: 131 10*3/uL — ABNORMAL LOW (ref 150–400)
RBC: 3.51 MIL/uL — ABNORMAL LOW (ref 3.87–5.11)
RDW: 14 % (ref 11.5–15.5)
WBC: 8.9 10*3/uL (ref 4.0–10.5)
nRBC: 0 % (ref 0.0–0.2)

## 2020-03-25 MED ORDER — LACTATED RINGERS IV BOLUS
500.0000 mL | Freq: Once | INTRAVENOUS | Status: AC
  Administered 2020-03-25: 500 mL via INTRAVENOUS

## 2020-03-25 MED ORDER — HYDROXYZINE HCL 25 MG PO TABS
25.0000 mg | ORAL_TABLET | Freq: Three times a day (TID) | ORAL | Status: DC | PRN
  Administered 2020-03-25 (×2): 25 mg via ORAL
  Filled 2020-03-25 (×3): qty 1

## 2020-03-25 NOTE — Progress Notes (Addendum)
POSTPARTUM PROGRESS NOTE  Subjective: Janice Bernard is a 39 y.o. [id number] s/p rLTCS at [redacted]w[redacted]d.  She reports that she is doing well. No acute events overnight. She denies any problems with ambulating or po intake. Denies nausea or vomiting. She has passed flatus. Pain is well controlled.  Lochia is mild. She endorses diffuse itching, slightly improved with benadryl.  Objective: Blood pressure 127/80, pulse 84, temperature 98.2 F (36.8 C), temperature source Axillary, resp. rate 18, height 5' (1.524 m), weight 65.7 kg, last menstrual period 07/03/2019, SpO2 96 %, unknown if currently breastfeeding.  Physical Exam:  General: alert, cooperative and no distress Chest: no respiratory distress Abdomen: soft, non-tender  Uterine Fundus: firm and at level of umbilicus Extremities: No calf swelling or tenderness   no LE edema  Recent Labs    03/24/20 0601 03/25/20 0600  HGB 12.2 10.5*  HCT 36.2 31.3*    Assessment/Plan: Paula Busenbark is a 39 y.o. [id number] s/p rLTCS at [redacted]w[redacted]d for failure to dilate in setting of IOL for A1GDM.  Routine Postpartum Care: Doing well, pain well-controlled. Hgb stable at 10.5 down from 12.2. -- Continue routine care, lactation support  -- Contraception: undecided -- Feeding: breast -- Hyperthyroidism: normal TFTs in 10/2019 and no current meds. Plan for repeat TFTs at 6 weeks postpartum.   Dispo: Plan for discharge POD#2-3.  Randa Ngo, MD OB Fellow, [facility] 03/25/2020 8:37 AM

## 2020-03-25 NOTE — Progress Notes (Signed)
CSW received consult for hx of PP Depression.  CSW met with MOB to offer support and complete assessment.    CSW used spanish interpretor  in house to speak with MOB. CSW congratulated MOB on the birth of infant. CSW advised MOB of the HIPPA policy in which MOB expressed that she was fine with sister in law staying in the  room while CSW spoke with her. CSW understanding and advised MOB of CSW's role and the reason for CSW coming speak with her. 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 MOB expressed that after the birth of her son then, she developed PPD. MOB expressed that she was placed on medications in which MOB expressed that she stopped taking once pregnancy was confirmed. MOB expressed no current or recent depression/PPD signs of symptoms at this time. MOB reported no desire to restate ,medcation and reported that she is seeing a support group at her church that is very helpful in managing her depression. MOB expressed no desire for other resources at this time and expressed feeling well since giving birth. MOB denies SI, HI, and DV.   MOB expressed that her supports include her family. MOB expressed that she has all needed items to care for infant with no other needs at this time.   CSW provided education regarding the baby blues period vs. perinatal mood disorders, discussed treatment and gave resources for mental health follow up if concerns arise.  CSW recommends self-evaluation during the postpartum time period using the New Mom Checklist from Postpartum Progress and encouraged MOB to contact a medical professional if symptoms are noted at any time.   CSW provided review of Sudden Infant Death Syndrome (SIDS) precautions.   CSW identifies no further need for intervention and no barriers to discharge at this time.   Virgie Dad Lenix Kidd, MSW, LCSW [facility] [phone]

## 2020-03-25 NOTE — Progress Notes (Signed)
Ordered pt breakfast, and other meals, assisted CSW with questions, by [place].

## 2020-03-25 NOTE — Anesthesia Postprocedure Evaluation (Signed)
Anesthesia Post Note  Patient: Idamay Hosein  Procedure(s) Performed: CESAREAN SECTION     Patient location during evaluation: PACU Anesthesia Type: Spinal Level of consciousness: oriented and awake and alert Pain management: pain level controlled Vital Signs Assessment: post-procedure vital signs reviewed and stable Respiratory status: spontaneous breathing, respiratory function stable and nonlabored ventilation Cardiovascular status: blood pressure returned to baseline and stable Postop Assessment: no headache, no backache, no apparent nausea or vomiting and spinal receding Anesthetic complications: no   No complications documented.  Last Vitals:  Vitals:   03/25/20 0043 03/25/20 0150  BP: 129/79   Pulse: 93   Resp: 16 18  Temp: 36.7 C 36.8 C  SpO2: 96%     Last Pain:  Vitals:   03/25/20 0150  TempSrc: Axillary  PainSc: 0-No pain   Pain Goal: Patients Stated Pain Goal: 0 (03/24/20 0457)                 Lidia Collum

## 2020-03-26 MED ORDER — COCONUT OIL OIL: 1.0000 "application " | TOPICAL_OIL | 0 refills | Status: DC | PRN

## 2020-03-26 MED ORDER — IBUPROFEN 800 MG PO TABS
800.0000 mg | ORAL_TABLET | Freq: Three times a day (TID) | ORAL | 0 refills | Status: DC | PRN
Start: 2020-03-26 — End: 2023-07-19

## 2020-03-26 MED ORDER — ACETAMINOPHEN 500 MG PO TABS
1000.0000 mg | ORAL_TABLET | Freq: Three times a day (TID) | ORAL | 0 refills | Status: DC | PRN
Start: 2020-03-26 — End: 2023-07-19

## 2020-03-26 NOTE — Lactation Note (Signed)
This note was copied from a baby's chart. Lactation Consultation Note  Patient Name: Janice Bernard ZLDJT'T Date: 03/26/2020 Reason for consult: Initial assessment;Early term 37-38.6wks;Infant weight loss P2, 28 hour ETI female infant with -2% weight loss. Spanish Interpreter used # Quillian Quince [phone] Mom is active on the [facility] program in [facility] but doesn't have a breast pump, LC explain their is hand pump in her DEBP kit and that she can take it home. Mom unsure of how many void and stools infant had, LC reinforced importance of documenting input, out put and how many times infant BF and the duration. Per mom, some of infant's latches has been painful, LC entered the room mom had infant latched in [place] hold, infant had shallow latch and without pillow support more on the tip of mom's nipple. Mom was will break latch and try the football hold position. Mom re-latched  Infant on her right breast using the football hold position, will pillow support and infant latched with depth, swallows observed. Infant was still breastfeeding after 10 minutes when LC left the room, infant is currently cluster feeding. Mom will continue to work towards latching infant at the breast and understands to call RN or [facility] for assistance with latching infant at the breast if needed.  Mom understands to BF infant according to hunger cues, 8 to 12+ times within 24 hours. Mom has coconut oil given by RN on her desk in room.  Mom will latch infant first at every feeding to help establish milk supply and then supplement with formula afterwards.  Mom is also supplementing infant with formula for some feedings. Mom was given the DEBP by RN but has not started using it, she plans to pump after latching infant at the next feeding. Mom understands to pump every 3 hours for 15 minutes on initial setting.   Maternal Data Formula Feeding for Exclusion: No Does the patient have breastfeeding experience prior to this  delivery?: Yes  Feeding Feeding Type: Breast Fed Nipple Type: Slow - flow  LATCH Score Latch: Grasps breast easily, tongue down, lips flanged, rhythmical sucking.  Audible Swallowing: Spontaneous and intermittent  Type of Nipple: Everted at rest and after stimulation  Comfort (Breast/Nipple): Soft / non-tender  Hold (Positioning): Assistance needed to correctly position infant at breast and maintain latch.  LATCH Score: 9  Interventions Interventions: Breast feeding basics reviewed;Assisted with latch;Breast compression;Skin to skin;Adjust position;Breast massage;Support pillows;Hand express;DEBP;Position options  Lactation Tools Discussed/Used WIC Program: Yes Pump Review: Setup, frequency, and cleaning;Milk Storage Initiated by:: RN Date initiated:: 03/25/20   Consult Status Consult Status: Follow-up Date: 03/26/20 Follow-up type: In-patient    Janice Bernard 03/26/2020, 12:58 AM

## 2020-04-01 ENCOUNTER — Ambulatory Visit: Payer: Self-pay

## 2020-04-12 ENCOUNTER — Ambulatory Visit (INDEPENDENT_AMBULATORY_CARE_PROVIDER_SITE_OTHER): Payer: Self-pay

## 2020-04-12 ENCOUNTER — Other Ambulatory Visit: Payer: Self-pay

## 2020-04-12 VITALS — BP 121/85 | HR 94 | Wt 123.4 lb

## 2020-04-12 DIAGNOSIS — Z5189 Encounter for other specified aftercare: Secondary | ICD-10-CM

## 2020-04-12 NOTE — Progress Notes (Signed)
Pt here today for incision check following repeat low transverse c-section at 37w 6d on 03/24/20. Per chart review pt no showed 1 week PP incision check. Incision is open to air, appears clean and dry with a few small, open healing areas. No edema, redness, or drainage to open areas. Steri strips applied to 2 open areas. Reviewed good wound care and s/s of infection. Pt requests antibiotic multiple times during visit. Explained purpose of antibiotic and reassured pt that there are no signs of infection so an antibiotic would not be helpful at this time. Pt reports continued pain to incision area. Reviewed pain management with Tylenol and ibuprofen. Pt reports not taking these regularly because she is concerned they will affect baby while breastfeeding. Encouraged pt to rotate Tylenol and ibuprofen every 4 hours. Pt reports having BM once daily that is hard to pass; recommended pt try Colace otc to see if this improves abdominal pain.  Pt will follow up at [facility] appt on 05/02/20. AMN interpreter Cori Razor ID #[id number] present virtually for entire encounter.   Apolonio Schneiders RN 04/12/20

## 2020-04-13 NOTE — Progress Notes (Signed)
Chart reviewed for nurse visit. Agree with plan of care.   Noni Saupe I, NP 04/13/2020 10:20 AM

## 2020-05-02 ENCOUNTER — Encounter: Payer: Self-pay | Admitting: Obstetrics and Gynecology

## 2020-05-02 ENCOUNTER — Ambulatory Visit (INDEPENDENT_AMBULATORY_CARE_PROVIDER_SITE_OTHER): Payer: Medicaid Other | Admitting: Obstetrics and Gynecology

## 2020-05-02 ENCOUNTER — Other Ambulatory Visit: Payer: Self-pay

## 2020-05-02 DIAGNOSIS — R202 Paresthesia of skin: Secondary | ICD-10-CM | POA: Diagnosis not present

## 2020-05-02 DIAGNOSIS — G5603 Carpal tunnel syndrome, bilateral upper limbs: Secondary | ICD-10-CM

## 2020-05-02 DIAGNOSIS — Z98891 History of uterine scar from previous surgery: Secondary | ICD-10-CM

## 2020-05-02 DIAGNOSIS — O99893 Other specified diseases and conditions complicating puerperium: Secondary | ICD-10-CM

## 2020-05-02 NOTE — Progress Notes (Addendum)
[place] Partum Visit Note  Janice Bernard is a 39 y.o. [phone] female who presents for a postpartum visit. She is 5 weeks postpartum following a repeat cesarean section.  I have fully reviewed the prenatal and intrapartum course. The delivery was at 37/6 gestational weeks.  Anesthesia: spinal. Postpartum course has been complicated by continued pain and numbness in both wrists and numbness in both feet. Baby is doing well. Baby is feeding by breast. Bleeding no bleeding. Bowel function is normal. Bladder function is normal. Patient is not sexually active. Contraception method is none. Postpartum depression screening: negative.   The pregnancy intention screening data noted above was reviewed. Potential methods of contraception were discussed. The patient elected to proceed with Vasectomy.    Edinburgh Postnatal Depression Scale - 05/02/20 1446      Edinburgh Postnatal Depression Scale:  In the Past 7 Days   I have been able to laugh and see the funny side of things. 0    I have looked forward with enjoyment to things. 0    I have blamed myself unnecessarily when things went wrong. 0    I have been anxious or worried for no good reason. 0    I have felt scared or panicky for no good reason. 0    Things have been getting on top of me. 2    I have been so unhappy that I have had difficulty sleeping. 0    I have felt sad or miserable. 1    I have been so unhappy that I have been crying. 0    The thought of harming myself has occurred to me. 0    Edinburgh Postnatal Depression Scale Total 3            The following portions of the patient's history were reviewed and updated as appropriate: allergies, current medications, past family history, past medical history, past social history, past surgical history and problem list.  Review of Systems Constitutional: negative Eyes: negative Ears, nose, mouth, throat, and face: negative Respiratory: negative Cardiovascular:  negative Gastrointestinal: negative Genitourinary:negative Integument/breast: negative Hematologic/lymphatic: negative Musculoskeletal:negative Neurological: positive for pain and numbness in both wrists and numbness in both feet Behavioral/Psych: negative Endocrine: negative Allergic/Immunologic: negative    Objective:  Blood pressure 118/76, pulse 91, weight 117 lb 14.4 oz (53.5 kg), currently breastfeeding.  General:  alert, cooperative and no distress   Breasts:  inspection negative, no nipple discharge or bleeding, no masses or nodularity palpable  Lungs: clear to auscultation bilaterally  Heart:  regular rate and rhythm, S1, S2 normal, no murmur, click, rub or gallop  Abdomen: soft, non-tender; bowel sounds normal; no masses,  no organomegaly -- pinpoint sized opening on RT end of incision, silver nitrate applied to area for cauterization after verbal consent of patient - patient tolerated procedure well   Vulva:  not evaluated  Vagina: not evaluated  Cervix:  not evaluated  Corpus: not examined  Adnexa:  not evaluated  Rectal Exam: Not performed.        Assessment:  Encounter for postpartum care of lactating mother - Normal postpartum exam. Pap smear not done at today's visit.  Carpal tunnel syndrome, bilateral - Plan: Ambulatory referral to Neurology  Plan:   Essential components of care per ACOG recommendations:  1.  Mood and well being: Patient with negative depression screening today. Reviewed local resources for support.  - Patient does not use tobacco.  - hx of drug use? No  2. Infant care  and feeding:  -Patient currently breastmilk feeding? Yes If breastmilk feeding discussed return to work and pumping. If needed, patient was provided letter for work to allow for every 2-3 hr pumping breaks, and to be granted a private location to express breastmilk and refrigerated area to store breastmilk. Reviewed importance of draining breast regularly to support  lactation. -Social determinants of health (SDOH) reviewed in EPIC. No concerns  3. Sexuality, contraception and birth spacing - Patient does not want a pregnancy in the next year.  Desired family size is 2 children.  - Reviewed forms of contraception in tiered fashion. Patient's husband desires vasectomy, but needs to call to schedule.   - Discussed birth spacing of 18 months  4. Sleep and fatigue -Encouraged family/partner/community support of 4 hrs of uninterrupted sleep to help with mood and fatigue  5. Physical Recovery  - Discussed patients delivery and complications - Patient had a repeat cesarean delivery, postoperative healing reviewed. Patient expressed understanding - Patient has urinary incontinence? No - Patient is safe to resume physical and sexual activity  6.  Health Maintenance - Last pap smear done 10/2019 and was normal. Mammogram at age 89  7. NoChronic Disease - PCP follow up - Ambulatory Referral for Neurology -- 07/14/2020  Laury Deep, [place] for [employer], [place]

## 2020-05-02 NOTE — Progress Notes (Signed)
Scheduled neurology appt for January 20 @ 1400 @ [facility].  Pt notified with Spanish Interpreter Idamae Lusher RN

## 2020-07-14 ENCOUNTER — Telehealth: Payer: Self-pay | Admitting: *Deleted

## 2020-07-14 ENCOUNTER — Ambulatory Visit: Payer: Self-pay | Admitting: Neurology

## 2020-07-14 NOTE — Telephone Encounter (Signed)
No showed new patient appointment. 

## 2020-08-03 IMAGING — US US OB < 14 WEEKS - US OB TV
1 series · 15 of 28 positions shown · non-contrast
Comparison: None.

CLINICAL DATA: Pain and vaginal bleeding

EXAM:
OBSTETRIC <14 WK US AND TRANSVAGINAL OB US
TECHNIQUE: Both transabdominal and transvaginal ultrasound examinations were
performed for complete evaluation of the gestation as well as the
maternal uterus, adnexal regions, and pelvic cul-de-sac.
Transvaginal technique was performed to assess early pregnancy.

[Series 1: us ob < 14 weeks - us ob tv · 45 acquisitions, 15 frames shown]
[im 1/45]
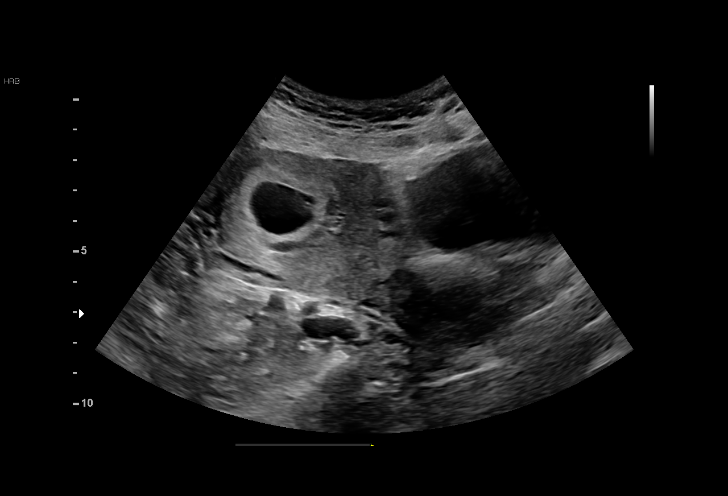
[im 4/45]
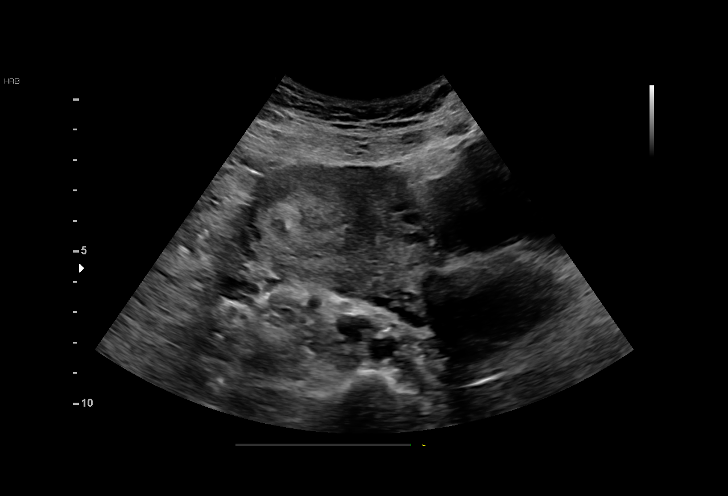
[im 7/45]
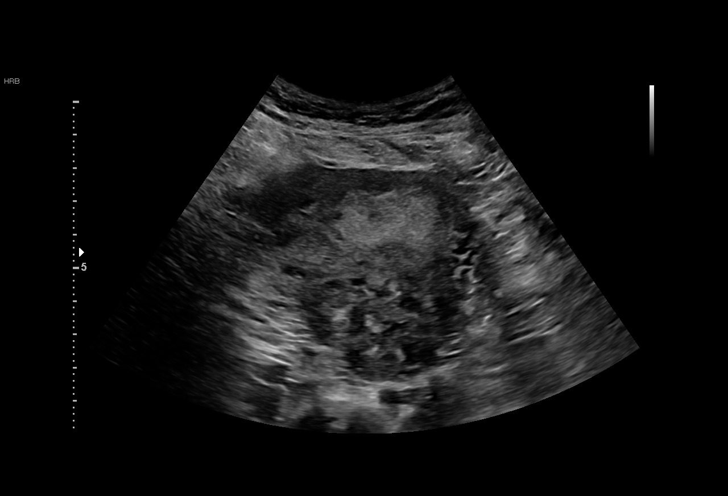
[im 10/45]
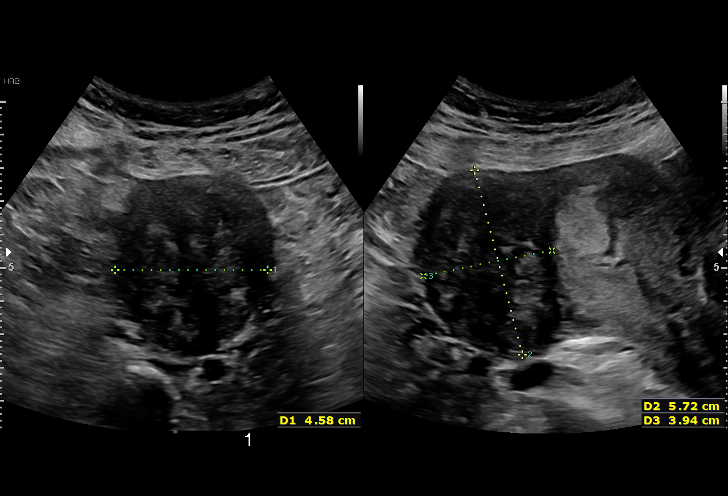
[im 14/45]
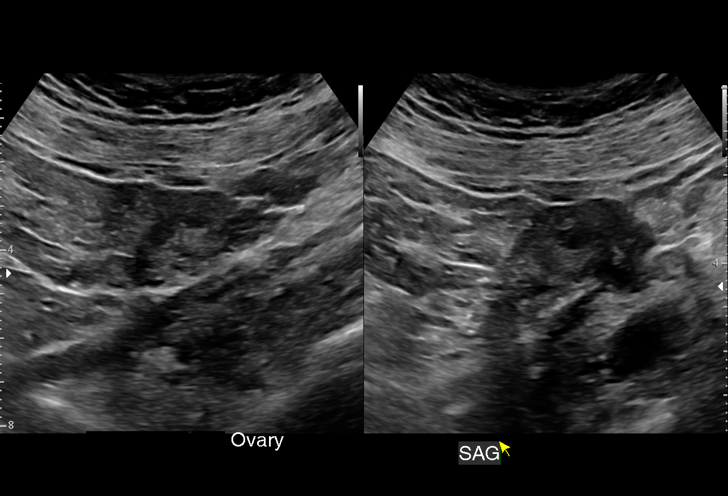
[im 17/45]
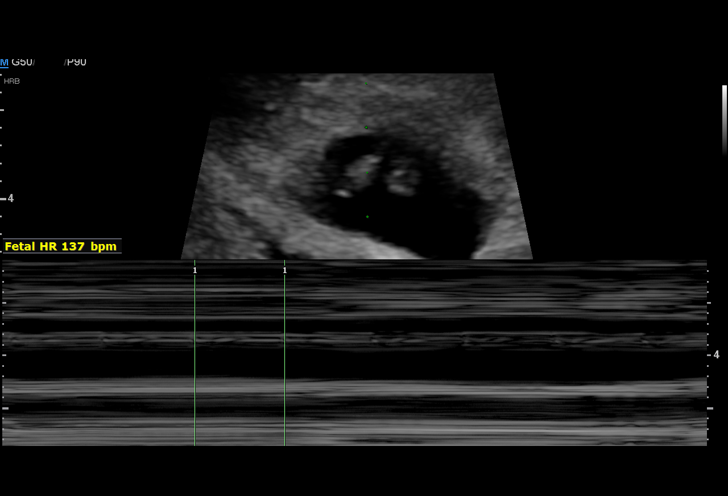
[im 20/45]
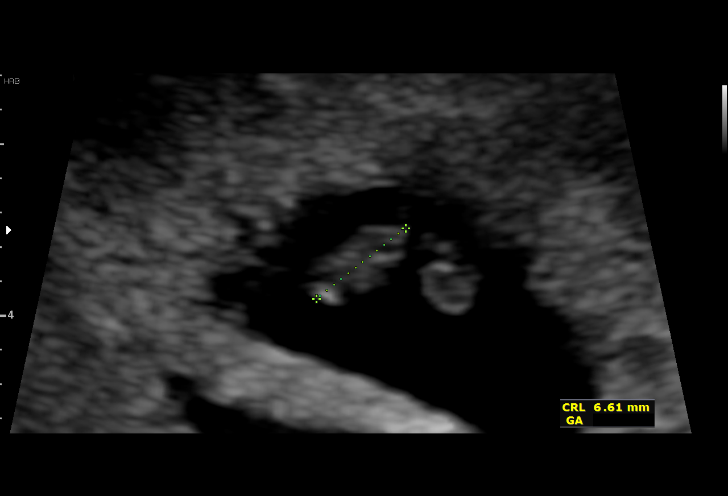
[im 23/45]
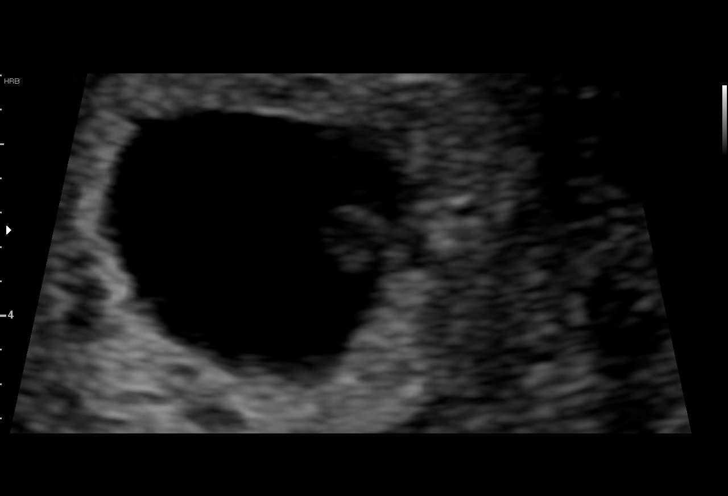
[im 25/45]
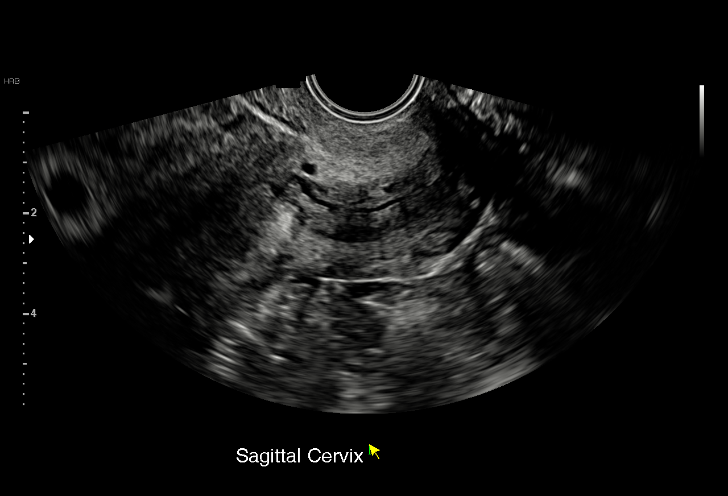
[im 28/45]
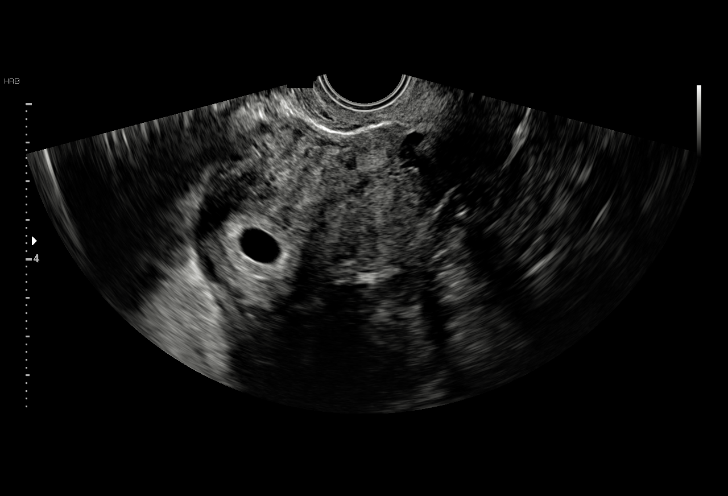
[im 31/45]
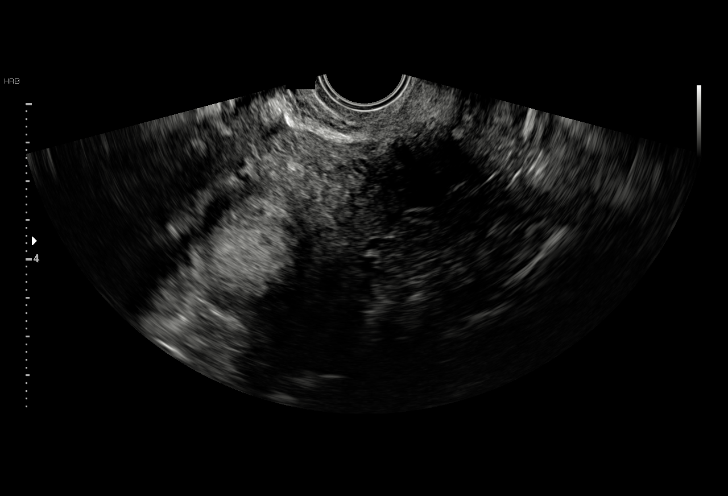
[im 35/45]
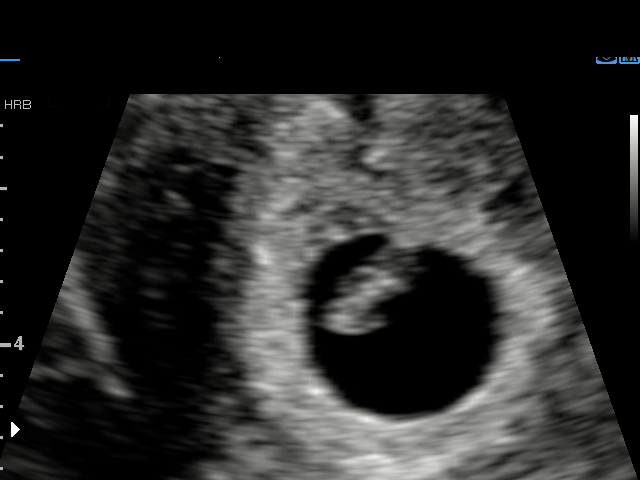
[im 38/45]
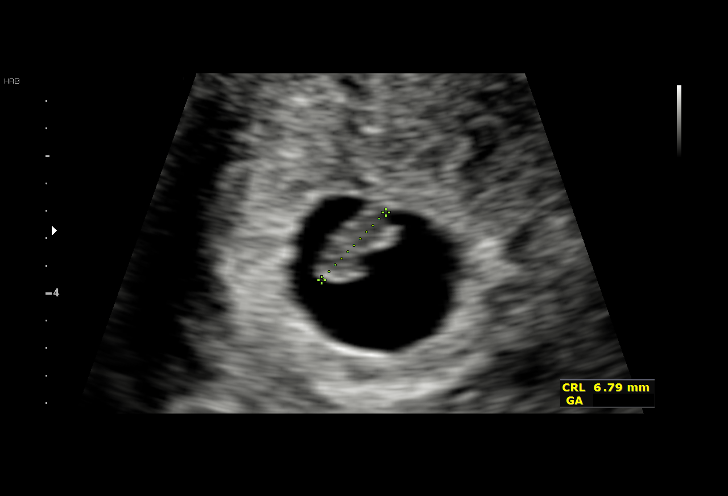
[im 41/45]
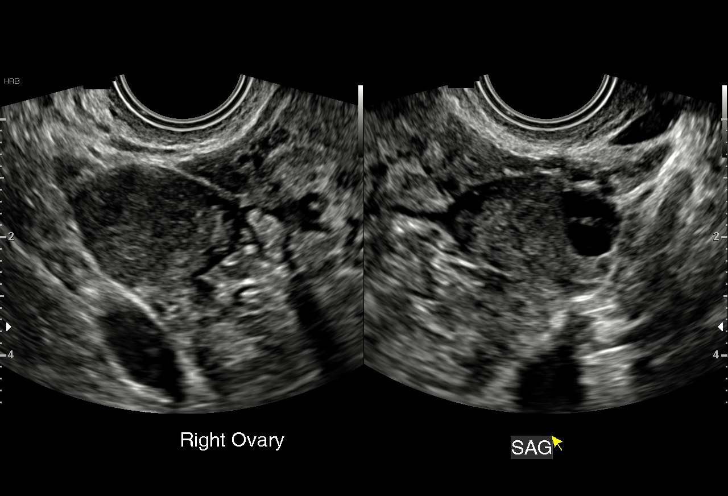
[im 45/45]
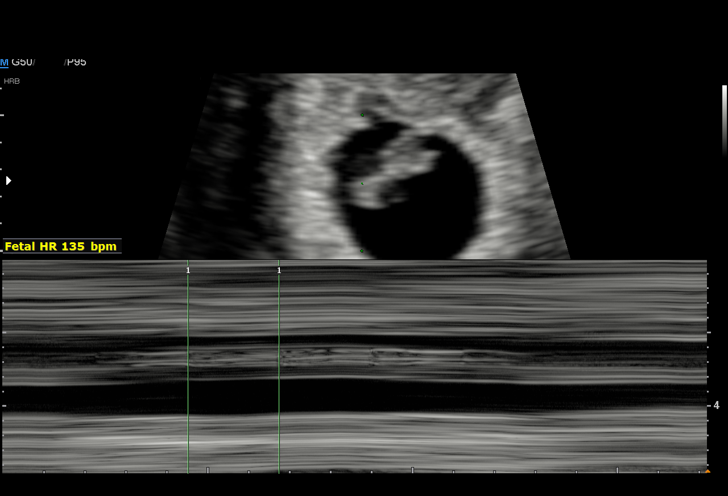

[15 of 28 positions shown; findings below may reference images not displayed]

FINDINGS: Intrauterine gestational sac: Visualized

Yolk sac:  Visualized

Embryo:  Visualized

Cardiac Activity: Visualized

Heart Rate: 137 bpm

CRL:  7 mm   6 w   3 d                  US EDC: April 08, 2020

Subchorionic hemorrhage: There is a subchorionic hemorrhage
measuring 1.7 x 0.5 cm.

Maternal uterus/adnexae: Cervical os is closed. There is a focal
leiomyoma arising from the posterior fundus of the uterus measuring
5.7 x 4.6 x 3.9 cm.

Right ovary measures 3.5 x 2.3 x 2.4 cm. Left ovary measures 1.7 x
3.4 x 2.2 cm. No extrauterine pelvic mass or free fluid.
IMPRESSION: 1. Single live intrauterine gestation with estimated gestational age
of 6+ weeks.

2.  Subchorionic hemorrhage measuring 1.7 x 0.5 cm.

3.  Posterior uterine leiomyoma measuring 5.7 x 4.6 x 3.9 cm.

4.  No extrauterine pelvic mass or free pelvic fluid.

## 2023-06-05 ENCOUNTER — Ambulatory Visit (INDEPENDENT_AMBULATORY_CARE_PROVIDER_SITE_OTHER): Payer: Self-pay | Admitting: Emergency Medicine

## 2023-06-05 ENCOUNTER — Encounter: Payer: Self-pay | Admitting: Emergency Medicine

## 2023-06-05 VITALS — BP 118/70 | HR 88 | Temp 98.5°F | Ht 60.0 in | Wt 121.0 lb

## 2023-06-05 DIAGNOSIS — E0590 Thyrotoxicosis, unspecified without thyrotoxic crisis or storm: Secondary | ICD-10-CM

## 2023-06-05 DIAGNOSIS — Z7689 Persons encountering health services in other specified circumstances: Secondary | ICD-10-CM

## 2023-06-05 NOTE — Progress Notes (Signed)
Janice Bernard 42 y.o.   Chief Complaint  Patient presents with   Establish Care    Patient here for med refills and establish care. [place] [address] [place] and found out her thyroid was elevated.     HISTORY OF PRESENT ILLNESS: This is a 42 y.o. female first visit to this office, here to establish care with me.  Accompanied by husband and young daughter. Has history of hypothyroidism diagnosed in [place] about 18 years ago Was started on medication and remains on medication for about 10 years Recently restarted again on methimazole and propranolol about 3 months ago when she went to [place] Recent blood work done locally.  Normal CBC and CMP.  Very low TSH and elevated thyroid hormones Scheduled to see endocrinologist next week December 17th. No other medical history Healthy lifestyle. No other complaint or medical concerns today.  HPI   Prior to Admission medications   Medication Sig Start Date End Date Taking? Authorizing Provider  propranolol (INDERAL) 20 MG tablet Take 20 mg by mouth 2 (two) times daily.   Yes [provider]  acetaminophen (TYLENOL) 500 MG tablet Take 2 tablets (1,000 mg total) by mouth every 8 (eight) hours as needed. Patient not taking: Reported on 06/05/2023 03/26/20   Sheila Oats, MD  coconut oil OIL Apply 1 application topically as needed (nipple pain). Patient not taking: Reported on 06/05/2023 03/26/20   Sheila Oats, MD  ibuprofen (ADVIL) 800 MG tablet Take 1 tablet (800 mg total) by mouth every 8 (eight) hours as needed. Patient not taking: Reported on 06/05/2023 03/26/20   Sheila Oats, MD  magnesium 30 MG tablet Take 30 mg by mouth 2 (two) times daily. Patient not taking: Reported on 06/05/2023    [provider]  Prenatal Vit-Fe Fumarate-FA (PRENATAL VITAMINS PO) Take 1 tablet by mouth daily. Patient not taking: Reported on 06/05/2023    [provider]    Allergies  Allergen Reactions    Dilaudid [Hydromorphone Hcl] Itching and Rash    Pt states she is allergic to an IV pain medication but unable to recall name of drug - reaction from 01/2011, only documented pain med administered during that time was fentanyl   Fentanyl Itching and Rash    Pt states she is allergic to an IV pain medication but unable to recall name of drug. - reaction from 01/2011, only documented pain med administered during that time was fentanyl   Morphine And Codeine Itching and Rash    Pt states she is allergic to an IV pain medication but unable to recall name of drug - reaction from 01/2011, only documented pain med administered during that time was fentanyl    Patient Active Problem List   Diagnosis Date Noted   Premature rupture of membranes 03/24/2020   Cervical polyp 03/24/2020   Cesarean delivery delivered 03/24/2020   Group B Streptococcus carrier, +RV culture, currently pregnant 03/21/2020   Carpal tunnel syndrome during pregnancy 02/11/2020   Supervision of high risk pregnancy, antepartum 10/19/2019   AMA (advanced maternal age) multigravida 35+ 10/19/2019   History of gestational diabetes mellitus (GDM) 10/19/2019   Fibroid    Hyperthyroidism    History of cesarean section     Past Medical History:  Diagnosis Date   Depressed    Hyperthyroidism     Past Surgical History:  Procedure Laterality Date   CESAREAN SECTION     CESAREAN SECTION  03/24/2020   Procedure:  CESAREAN SECTION;  Surgeon: Malachy Chamber, MD;  Location: [facility] LD ORS;  Service: Obstetrics;;   DILATION AND EVACUATION  02/06/2011   Procedure: DILATATION AND EVACUATION (D&E);  Surgeon: Scheryl Darter, MD;  Location: [facility] ORS;  Service: Gynecology;  Laterality: N/A;   DILATION AND EVACUATION  02/22/2011   Procedure: DILATATION AND EVACUATION (D&E);  Surgeon: Scheryl Darter, MD;  Location: [facility] ORS;  Service: Gynecology;  Laterality: N/A;    Social History   Socioeconomic History   Marital status: Married    Spouse  name: Marcello Moores   Number of children: 2   Years of education: Not on file   Highest education level: Not on file  Occupational History   Not on file  Tobacco Use   Smoking status: Never   Smokeless tobacco: Never  Vaping Use   Vaping status: Never Used  Substance and Sexual Activity   Alcohol use: Not Currently    Alcohol/week: 3.0 standard drinks of alcohol    Types: 3 Cans of beer per week    Comment: Social   Drug use: No   Sexual activity: Yes    Birth control/protection: None, Condom  Other Topics Concern   Not on file  Social History Narrative   Not on file   Social Determinants of Health   Financial Resource Strain: Not on file  Food Insecurity: No Food Insecurity (03/18/2020)   Hunger Vital Sign    Worried About Running Out of Food in the Last Year: Never true    Ran Out of Food in the Last Year: Never true  Transportation Needs: No Transportation Needs (03/18/2020)   PRAPARE - Administrator, Civil Service (Medical): No    Lack of Transportation (Non-Medical): No  Physical Activity: Not on file  Stress: Not on file  Social Connections: Not on file  Intimate Partner Violence: Not At Risk (11/04/2019)   Humiliation, Afraid, Rape, and Kick questionnaire    Fear of Current or Ex-Partner: No    Emotionally Abused: No    Physically Abused: No    Sexually Abused: No    Family History  Problem Relation Age of Onset   Cancer Father    Throat cancer Father    Thrombocytopenia Sister    Thrombocytopenia Sister    Thrombocytopenia Brother    Diabetes Paternal Uncle    Anesthesia problems Neg Hx    Other Neg Hx      Review of Systems  Constitutional: Negative.  Negative for chills and fever.  HENT: Negative.  Negative for congestion and sore throat.   Respiratory: Negative.  Negative for cough and shortness of breath.   Cardiovascular: Negative.  Negative for chest pain and palpitations.  Gastrointestinal:  Negative for abdominal pain, diarrhea,  nausea and vomiting.  Genitourinary: Negative.  Negative for dysuria and hematuria.  Skin: Negative.  Negative for rash.  Neurological: Negative.  Negative for dizziness and headaches.  All other systems reviewed and are negative.   Today's Vitals   06/05/23 0901  BP: 118/70  Pulse: 88  Temp: 98.5 F (36.9 C)  TempSrc: Oral  SpO2: 98%  Weight: 121 lb (54.9 kg)  Height: 5' (1.524 m)   Body mass index is 23.63 kg/m.   Physical Exam Vitals reviewed.  Constitutional:      Appearance: Normal appearance.  HENT:     Head: Normocephalic.     Mouth/Throat:     Mouth: Mucous membranes are moist.     Pharynx: Oropharynx is  clear.  Eyes:     Extraocular Movements: Extraocular movements intact.     Pupils: Pupils are equal, round, and reactive to light.  Neck:     Comments: Enlarged nontender thyroid Cardiovascular:     Rate and Rhythm: Normal rate and regular rhythm.     Pulses: Normal pulses.     Heart sounds: Normal heart sounds.  Pulmonary:     Effort: Pulmonary effort is normal.     Breath sounds: Normal breath sounds.  Skin:    General: Skin is warm and dry.     Capillary Refill: Capillary refill takes less than 2 seconds.  Neurological:     General: No focal deficit present.     Mental Status: She is alert and oriented to person, place, and time.  Psychiatric:        Mood and Affect: Mood normal.        Behavior: Behavior normal.      ASSESSMENT & PLAN:  Problem List Items Addressed This Visit       Endocrine   Hyperthyroidism    Clinically stable.  Normal vital signs and afebrile Continue methimazole and propranolol 20 mg twice a day Scheduled to see endocrinologist next week on December 17th We will follow-up after that      Relevant Medications   propranolol (INDERAL) 20 MG tablet   Other Visit Diagnoses     Encounter to establish care    -  Primary      Patient Instructions  Hipertiroidismo Hyperthyroidism El hipertiroidismo se refiere a  la glndula tiroidea demasiado activa o hiperactiva. La glndula tiroidea es una pequea glndula ubicada en la parte delantera inferior del cuello, justo delante de la trquea. Esta glndula produce hormonas que: Ayudan a Chief Operating Officer cmo el cuerpo [place] los alimentos para obtener energa (metabolismo). Ayudan al corazn y al cerebro a funcionar bien. Mantienen los [employer]. Cuando la tiroides est hiperactiva, produce una cantidad excesiva de una hormona denominada tiroxina. Cules son las causas? Esta afeccin puede ser causada por lo siguiente: Enfermedad de [place]. Este es un trastorno en el que el sistema del cuerpo encargado de combatir las enfermedades (sistema inmunitario) ataca la glndula tiroidea. Esta es la causa ms frecuente. Inflamacin de la glndula tiroidea. Un tumor en la glndula tiroidea. Uso de ciertos medicamentos, como los siguientes: Reemplazo de hormona tiroidea recetado. Suplementos a base de hierbas que imitan a las hormonas tiroideas. Terapia con amiodarona. Bultos slidos o llenos de lquido en la glndula tiroidea (ndulos en la glndula tiroidea). Ingerir una gran cantidad de yodo de alimentos o medicamentos. Qu incrementa el riesgo? Es ms probable que sufra esta afeccin si: Es mujer. Tiene antecedentes familiares de afecciones tiroideas. Fuma tabaco. [place] un medicamento denominado litio. Toma medicamentos que afectan el sistema inmunitario (inmunosupresores). Cules son los signos o sntomas? Los sntomas de esta afeccin incluyen: Nerviosismo. Incapacidad para Patent examiner. Diarrea. Frecuencia cardaca acelerada. Temblores en las manos. Agitacin. Problemas para dormir. Otros sntomas pueden incluir: El corazn se saleta latidos o late de ms. Prdida de peso sin causa aparente. Cambios en la textura del pelo o la piel. Ausencia de la [employer]. Fatiga. Agrandamiento de la glndula tiroidea o un bulto en la tiroides (ndulo). Es  posible que tambin tenga sntomas de la enfermedad de [place], los cuales pueden incluir: Ojos saltones. Ojos secos. Ojos rojos o hinchados. Problemas visuales. Cmo se diagnostica? Esta afeccin se puede diagnosticar en funcin de lo siguiente: Los sntomas y los antecedentes mdicos. Un  examen fsico. Anlisis de sangre. Ecografa de la tiroides. En [employer], se utilizan ondas sonoras para generar imgenes de la glndula tiroidea. Gammagrafa tiroidea. Se inyecta una sustancia radiactiva en una vena y las imgenes muestran la cantidad de yodo presente en la tiroides. Prueba de captacin de yodo radiactivo (radioactive iodine uptake test, RAIU). Una pequea cantidad de yodo radiactivo se administra por boca para determinar la cantidad de yodo que absorbe la tiroides despus de un determinado perodo de [place]. Cmo se trata? El tratamiento depende de la causa y la gravedad de la afeccin. El tratamiento puede incluir: Medicamentos para reducir la cantidad de hormona tiroidea que produce su cuerpo. Medicamentos para ayudar a Merchant navy officer. Tratamiento con yodo radiactivo (terapia con yodo radiactivo). Consiste en tragar Neomia Dear pequea dosis de yodo radiactivo, en cpsula o lquido, a fin de destruir las clulas tiroideas. Ciruga para extirpar toda o parte de la glndula tiroidea. Es posible que necesite tomar medicamentos de reemplazo de hormona tiroidea por el resto de su vida despus de Neomia Dear ciruga de la tiroides. Siga estas instrucciones en su casa:  Use los medicamentos de venta libre y los recetados solamente como se lo haya indicado el mdico. No consuma ningn producto que contenga nicotina o tabaco. Estos productos incluyen cigarrillos, tabaco para Theatre manager y aparatos de vapeo, como los Administrator, Civil Service. Si necesita ayuda para dejar de fumar, consulte al [employer]. Siga las instrucciones del mdico con respecto a la dieta. Es posible que le indiquen limitar los alimentos  que contengan yodo. Concurra a todas las visitas de seguimiento. Tendr que hacerse anlisis de sangre peridicamente, de modo que el mdico pueda Passenger transport manager. Dnde obtener ms informacin [employer] of Diabetes and Digestive and Kidney Diseases [employer] de la Diabetes y las Enfermedades Digestivas y Renales): [employer].si Comunquese con un mdico si: Los sntomas no mejoran con Scientist, research (medical). Tiene fiebre. Siente dolor abdominal. Siente mareos. Est tomando medicamentos de reemplazo de la hormona tiroidea y: Tiene sntomas de depresin. Siente cansancio constantemente. Aumenta de [place]. Solicite ayuda de inmediato si: Tiene un episodio repentino de confusin que no tiene explicacin u otros Air cabin crew. Siente dolor en el pecho. Tiene latidos cardacos irregulares o acelerados (palpitaciones). Tiene dificultad para respirar. Estos sntomas pueden Customer service manager. Solicite ayuda de inmediato. Llame al 911. No espere a ver si los sntomas desaparecen. No conduzca por sus propios medios [employer]. Resumen La glndula tiroidea es una pequea glndula ubicada en la parte delantera inferior del cuello, justo delante de la trquea. El hipertiroidismo ocurre cuando la glndula tiroidea est demasiado [place] y produce una cantidad excesiva de una hormona denominada tiroxina. La causa ms frecuente es la enfermedad de [place], un trastorno por el cual el sistema inmunitario ataca la glndula tiroidea. El hipertiroidismo puede causar diferentes sntomas, por ejemplo, prdida de peso inexplicable, nerviosismo, incapacidad de Patent examiner o cambios en los latidos cardacos. El tratamiento puede incluir medicamentos para reducir la cantidad de hormona tiroidea que produce su cuerpo, terapia con yodo radiactivo, ciruga o medicamentos para controlar los sntomas. Esta informacin no tiene Theme park manager el consejo del mdico. Asegrese de  hacerle al mdico cualquier pregunta que tenga. Document Revised: 08/23/2021 Document Reviewed: 08/23/2021 Elsevier Patient Education  2024 Elsevier Inc.     Edwina Barth, MD [facility]

## 2023-06-05 NOTE — Patient Instructions (Signed)
Hipertiroidismo Hyperthyroidism El hipertiroidismo se refiere a la glndula tiroidea demasiado activa o hiperactiva. La glndula tiroidea es una pequea glndula ubicada en la parte delantera inferior del cuello, justo delante de la trquea. Esta glndula produce hormonas que: Ayudan a Chief Operating Officer cmo el cuerpo [place] los alimentos para obtener energa (metabolismo). Ayudan al corazn y al cerebro a funcionar bien. Mantienen los [employer]. Cuando la tiroides est hiperactiva, produce una cantidad excesiva de una hormona denominada tiroxina. Cules son las causas? Esta afeccin puede ser causada por lo siguiente: Enfermedad de [place]. Este es un trastorno en el que el sistema del cuerpo encargado de combatir las enfermedades (sistema inmunitario) ataca la glndula tiroidea. Esta es la causa ms frecuente. Inflamacin de la glndula tiroidea. Un tumor en la glndula tiroidea. Uso de ciertos medicamentos, como los siguientes: Reemplazo de hormona tiroidea recetado. Suplementos a base de hierbas que imitan a las hormonas tiroideas. Terapia con amiodarona. Bultos slidos o llenos de lquido en la glndula tiroidea (ndulos en la glndula tiroidea). Ingerir una gran cantidad de yodo de alimentos o medicamentos. Qu incrementa el riesgo? Es ms probable que sufra esta afeccin si: Es mujer. Tiene antecedentes familiares de afecciones tiroideas. Fuma tabaco. [place] un medicamento denominado litio. Toma medicamentos que afectan el sistema inmunitario (inmunosupresores). Cules son los signos o sntomas? Los sntomas de esta afeccin incluyen: Nerviosismo. Incapacidad para Patent examiner. Diarrea. Frecuencia cardaca acelerada. Temblores en las manos. Agitacin. Problemas para dormir. Otros sntomas pueden incluir: El corazn se saleta latidos o late de ms. Prdida de peso sin causa aparente. Cambios en la textura del pelo o la piel. Ausencia de la  [employer]. Fatiga. Agrandamiento de la glndula tiroidea o un bulto en la tiroides (ndulo). Es posible que tambin tenga sntomas de la enfermedad de [place], los cuales pueden incluir: Ojos saltones. Ojos secos. Ojos rojos o hinchados. Problemas visuales. Cmo se diagnostica? Esta afeccin se puede diagnosticar en funcin de lo siguiente: Los sntomas y los antecedentes mdicos. Un examen fsico. Anlisis de sangre. Ecografa de la tiroides. En [employer], se utilizan ondas sonoras para generar imgenes de la glndula tiroidea. Gammagrafa tiroidea. Se inyecta una sustancia radiactiva en una vena y las imgenes muestran la cantidad de yodo presente en la tiroides. Prueba de captacin de yodo radiactivo (radioactive iodine uptake test, RAIU). Una pequea cantidad de yodo radiactivo se administra por boca para determinar la cantidad de yodo que absorbe la tiroides despus de un determinado perodo de [place]. Cmo se trata? El tratamiento depende de la causa y la gravedad de la afeccin. El tratamiento puede incluir: Medicamentos para reducir la cantidad de hormona tiroidea que produce su cuerpo. Medicamentos para ayudar a Merchant navy officer. Tratamiento con yodo radiactivo (terapia con yodo radiactivo). Consiste en tragar Neomia Dear pequea dosis de yodo radiactivo, en cpsula o lquido, a fin de destruir las clulas tiroideas. Ciruga para extirpar toda o parte de la glndula tiroidea. Es posible que necesite tomar medicamentos de reemplazo de hormona tiroidea por el resto de su vida despus de Neomia Dear ciruga de la tiroides. Siga estas instrucciones en su casa:  Use los medicamentos de venta libre y los recetados solamente como se lo haya indicado el mdico. No consuma ningn producto que contenga nicotina o tabaco. Estos productos incluyen cigarrillos, tabaco para Theatre manager y aparatos de vapeo, como los Administrator, Civil Service. Si necesita ayuda para dejar de fumar, consulte al [employer]. Siga  las instrucciones del mdico con respecto a la dieta. Es posible que Social worker los alimentos  que contengan yodo. Concurra a todas las visitas de seguimiento. Tendr que hacerse anlisis de sangre peridicamente, de modo que el mdico pueda Passenger transport manager. Dnde obtener ms informacin [employer] of Diabetes and Digestive and Kidney Diseases [employer] de la Diabetes y las Enfermedades Digestivas y Renales): [employer].si Comunquese con un mdico si: Los sntomas no mejoran con Scientist, research (medical). Tiene fiebre. Siente dolor abdominal. Siente mareos. Est tomando medicamentos de reemplazo de la hormona tiroidea y: Tiene sntomas de depresin. Siente cansancio constantemente. Aumenta de [place]. Solicite ayuda de inmediato si: Tiene un episodio repentino de confusin que no tiene explicacin u otros Air cabin crew. Siente dolor en el pecho. Tiene latidos cardacos irregulares o acelerados (palpitaciones). Tiene dificultad para respirar. Estos sntomas pueden Customer service manager. Solicite ayuda de inmediato. Llame al 911. No espere a ver si los sntomas desaparecen. No conduzca por sus propios medios [employer]. Resumen La glndula tiroidea es una pequea glndula ubicada en la parte delantera inferior del cuello, justo delante de la trquea. El hipertiroidismo ocurre cuando la glndula tiroidea est demasiado [place] y produce una cantidad excesiva de una hormona denominada tiroxina. La causa ms frecuente es la enfermedad de [place], un trastorno por el cual el sistema inmunitario ataca la glndula tiroidea. El hipertiroidismo puede causar diferentes sntomas, por ejemplo, prdida de peso inexplicable, nerviosismo, incapacidad de Patent examiner o cambios en los latidos cardacos. El tratamiento puede incluir medicamentos para reducir la cantidad de hormona tiroidea que produce su cuerpo, terapia con yodo radiactivo, ciruga o medicamentos para controlar  los sntomas. Esta informacin no tiene Theme park manager el consejo del mdico. Asegrese de hacerle al mdico cualquier pregunta que tenga. Document Revised: 08/23/2021 Document Reviewed: 08/23/2021 Elsevier Patient Education  2024 [employer].

## 2023-06-05 NOTE — Assessment & Plan Note (Signed)
Clinically stable.  Normal vital signs and afebrile Continue methimazole and propranolol 20 mg twice a day Scheduled to see endocrinologist next week on December 17th We will follow-up after that

## 2023-07-18 ENCOUNTER — Emergency Department (HOSPITAL_COMMUNITY)
Admission: EM | Admit: 2023-07-18 | Discharge: 2023-07-19 | Disposition: A | Discharge status: Home / Self Care | Payer: Self-pay | Attending: Emergency Medicine | Admitting: Emergency Medicine

## 2023-07-18 ENCOUNTER — Other Ambulatory Visit: Payer: Self-pay

## 2023-07-18 ENCOUNTER — Encounter (HOSPITAL_COMMUNITY): Payer: Self-pay

## 2023-07-18 DIAGNOSIS — N751 Abscess of Bartholin's gland: Secondary | ICD-10-CM | POA: Insufficient documentation

## 2023-07-18 MED ORDER — ONDANSETRON 4 MG PO TBDP
4.0000 mg | ORAL_TABLET | Freq: Once | ORAL | Status: AC
  Administered 2023-07-18: 4 mg via ORAL
  Filled 2023-07-18: qty 1

## 2023-07-18 MED ORDER — OXYCODONE-ACETAMINOPHEN 5-325 MG PO TABS
1.0000 | ORAL_TABLET | Freq: Once | ORAL | Status: AC
  Administered 2023-07-18: 1 via ORAL
  Filled 2023-07-18: qty 1

## 2023-07-18 NOTE — ED Provider Triage Note (Signed)
Emergency Medicine Provider Triage Evaluation Note  Janice Bernard , a 43 y.o. female  was evaluated in triage.  Pt complains of Bartholin's gland cyst.  She went to an urgent care and had an I&D and is on doxycycline however she has had worsening pain and swelling and now states it is very difficult for her to walk.  She denies fever or chills.  Review of Systems  Positive: Bartholin cyst Negative: EVAR  Physical Exam  BP 114/88 (BP Location: Left Arm)   Pulse 67   Temp 98.6 F (37 C) (Oral)   Resp 16   Wt 54.9 kg   SpO2 100%   BMI 23.63 kg/m  Gen:   Awake, no distress   Resp:  Normal effort  MSK:   Moves extremities without difficulty  Other:   Medical Decision Making  Medically screening exam initiated at 5:17 PM.  Appropriate orders placed.  Clo Aldi was informed that the remainder of the evaluation will be completed by another provider, this initial triage assessment does not replace that evaluation, and the importance of remaining in the ED until their evaluation is complete.     Arthor Captain, PA-C 07/18/23 1718

## 2023-07-18 NOTE — ED Triage Notes (Addendum)
Pt arrives with c/o cyst in vaginal area that she had drained recently. Pe pt, she returns because the cyst has filled back up and is painful. Pt denies fevers.

## 2023-07-19 LAB — URINALYSIS, ROUTINE W REFLEX MICROSCOPIC
Bacteria, UA: NONE SEEN
Bilirubin Urine: NEGATIVE
Glucose, UA: NEGATIVE mg/dL
Ketones, ur: NEGATIVE mg/dL
Leukocytes,Ua: NEGATIVE
Nitrite: NEGATIVE
Protein, ur: NEGATIVE mg/dL
RBC / HPF: >50 RBC/hpf (ref 0–5)
Specific Gravity, Urine: 1.009 (ref 1.005–1.030)
pH: 6 (ref 5.0–8.0)

## 2023-07-19 LAB — PREGNANCY, URINE: Preg Test, Ur: NEGATIVE

## 2023-07-19 MED ORDER — OXYCODONE HCL 5 MG PO TABS: 5.0000 mg | ORAL_TABLET | ORAL | 0 refills | Status: AC | PRN

## 2023-07-19 MED ORDER — LIDOCAINE-EPINEPHRINE-TETRACAINE (LET) TOPICAL GEL
3.0000 mL | Freq: Once | TOPICAL | Status: AC
  Administered 2023-07-19: 3 mL via TOPICAL

## 2023-07-19 MED ORDER — AMOXICILLIN-POT CLAVULANATE 875-125 MG PO TABS: 1.0000 | ORAL_TABLET | Freq: Two times a day (BID) | ORAL | 0 refills | Status: AC

## 2023-07-19 MED ORDER — LIDOCAINE HCL (PF) 1 % IJ SOLN
5.0000 mL | Freq: Once | INTRAMUSCULAR | Status: AC
  Administered 2023-07-19: 5 mL
  Filled 2023-07-19: qty 5

## 2023-07-19 MED ORDER — LIDOCAINE-EPINEPHRINE-TETRACAINE (LET) TOPICAL GEL
3.0000 mL | Freq: Once | TOPICAL | Status: DC
  Filled 2023-07-19: qty 3

## 2023-07-19 MED ORDER — ACETAMINOPHEN 325 MG PO TABS: 650.0000 mg | ORAL_TABLET | Freq: Four times a day (QID) | ORAL | 0 refills | Status: AC | PRN

## 2023-07-19 NOTE — ED Provider Notes (Signed)
[place] [facility]GENC[facility] DEPARTMENT AT [facility] Provider Note  CSN: [id number] Arrival date & time: 07/18/23 1353  Chief Complaint(s) Bartholin's Cyst  HPI Janice Bernard is a 43 y.o. female with past medical history as below, significant for hypothyroid, depressed, gestational diabetes, prior C-section who presents to the ED with complaint of vaginal pain  She reports she recently had Bartholin abscess drained at OSH, start on doxycycline.  This was on Tuesday.  Over the past 24 hours she had noticed recurrence of the pain and discomfort to her vaginal area that she experienced prior to the drainage of the Bartholin abscess.  No fevers, chills, nausea or vomiting.  No change in bowel or bladder function.  She has been compliant with doxycycline.  Reports that she does not currently follow with OB/GYN  Past Medical History Past Medical History:  Diagnosis Date   Depressed    Hyperthyroidism    Patient Active Problem List   Diagnosis Date Noted   Premature rupture of membranes 03/24/2020   Cervical polyp 03/24/2020   Cesarean delivery delivered 03/24/2020   Group B Streptococcus carrier, +RV culture, currently pregnant 03/21/2020   Carpal tunnel syndrome during pregnancy 02/11/2020   Supervision of high risk pregnancy, antepartum 10/19/2019   AMA (advanced maternal age) multigravida 35+ 10/19/2019   History of gestational diabetes mellitus (GDM) 10/19/2019   Fibroid    Hyperthyroidism    History of cesarean section    Home Medication(s) Prior to Admission medications   Medication Sig Start Date End Date Taking? Authorizing Provider  acetaminophen (TYLENOL) 325 MG tablet Take 2 tablets (650 mg total) by mouth every 6 (six) hours as needed. 07/19/23  Yes Tanda Rockers A, DO  amoxicillin-clavulanate (AUGMENTIN) 875-125 MG tablet Take 1 tablet by mouth every 12 (twelve) hours. 07/19/23  Yes Tanda Rockers A, DO  oxyCODONE (ROXICODONE) 5 MG immediate release tablet Take 1  tablet (5 mg total) by mouth every 4 (four) hours as needed for severe pain (pain score 7-10). 07/19/23  Yes Sloan Leiter, DO  Prenatal Vit-Fe Fumarate-FA (PRENATAL VITAMINS PO) Take 1 tablet by mouth daily. Patient not taking: Reported on 06/05/2023    [provider]  propranolol (INDERAL) 20 MG tablet Take 20 mg by mouth 2 (two) times daily.    [provider]                                                                                                                                    Past Surgical History Past Surgical History:  Procedure Laterality Date   CESAREAN SECTION     CESAREAN SECTION  03/24/2020   Procedure: CESAREAN SECTION;  Surgeon: Malachy Chamber, MD;  Location: [facility] LD ORS;  Service: Obstetrics;;   DILATION AND EVACUATION  02/06/2011   Procedure: DILATATION AND EVACUATION (D&E);  Surgeon: Scheryl Darter, MD;  Location: [facility] ORS;  Service: Gynecology;  Laterality: N/A;   DILATION  AND EVACUATION  02/22/2011   Procedure: DILATATION AND EVACUATION (D&E);  Surgeon: Scheryl Darter, MD;  Location: [facility] ORS;  Service: Gynecology;  Laterality: N/A;   Family History Family History  Problem Relation Age of Onset   Cancer Father    Throat cancer Father    Thrombocytopenia Sister    Thrombocytopenia Sister    Thrombocytopenia Brother    Diabetes Paternal Uncle    Anesthesia problems Neg Hx    Other Neg Hx     Social History Social History   Tobacco Use   Smoking status: Never   Smokeless tobacco: Never  Vaping Use   Vaping status: Never Used  Substance Use Topics   Alcohol use: Not Currently    Alcohol/week: 3.0 standard drinks of alcohol    Types: 3 Cans of beer per week    Comment: Social   Drug use: No   Allergies Dilaudid [hydromorphone hcl], Fentanyl, and Morphine and codeine  Review of Systems Review of Systems  Constitutional:  Negative for chills and fever.  Respiratory:  Negative for chest tightness and shortness of breath.    Cardiovascular:  Negative for chest pain.  Gastrointestinal:  Negative for abdominal pain and nausea.  Genitourinary:  Positive for vaginal pain. Negative for dysuria.  Neurological:  Negative for syncope and headaches.  All other systems reviewed and are negative.   Physical Exam Vital Signs  I have reviewed the triage vital signs BP 118/84   Pulse 73   Temp 98.2 F (36.8 C)   Resp 18   Wt 54.9 kg   SpO2 98%   BMI 23.63 kg/m  Physical Exam Vitals and nursing note reviewed. Exam conducted with a chaperone present (amanda fox rn chaperone).  Constitutional:      General: She is not in acute distress.    Appearance: Normal appearance. She is well-developed. She is not ill-appearing.  HENT:     Head: Normocephalic and atraumatic.     Right Ear: External ear normal.     Left Ear: External ear normal.     Nose: Nose normal.     Mouth/Throat:     Mouth: Mucous membranes are moist.  Eyes:     General: No scleral icterus.       Right eye: No discharge.        Left eye: No discharge.  Cardiovascular:     Rate and Rhythm: Normal rate.  Pulmonary:     Effort: Pulmonary effort is normal. No respiratory distress.     Breath sounds: No stridor.  Abdominal:     General: Abdomen is flat. There is no distension.     Palpations: Abdomen is soft.     Tenderness: There is no abdominal tenderness. There is no guarding.  Genitourinary:    Comments: Fluctuance/induration to left lower aspect of labia Musculoskeletal:        General: No deformity.     Cervical back: No rigidity.  Skin:    General: Skin is warm and dry.     Coloration: Skin is not cyanotic, jaundiced or pale.  Neurological:     Mental Status: She is alert and oriented to person, place, and time.     GCS: GCS eye subscore is 4. GCS verbal subscore is 5. GCS motor subscore is 6.  Psychiatric:        Speech: Speech normal.        Behavior: Behavior normal. Behavior is cooperative.     ED Results and  Treatments Labs (  all labs ordered are listed, but only abnormal results are displayed) Labs Reviewed  URINALYSIS, ROUTINE W REFLEX MICROSCOPIC - Abnormal; Notable for the following components:      Result Value   Hgb urine dipstick LARGE (*)    All other components within normal limits  PREGNANCY, URINE                                                                                                                          Radiology No results found.  Pertinent labs & imaging results that were available during my care of the patient were reviewed by me and considered in my medical decision making (see MDM for details).  Medications Ordered in ED Medications  oxyCODONE-acetaminophen (PERCOCET/ROXICET) 5-325 MG per tablet 1 tablet (1 tablet Oral Given 07/18/23 1742)  ondansetron (ZOFRAN-ODT) disintegrating tablet 4 mg (4 mg Oral Given 07/18/23 1743)  lidocaine (PF) (XYLOCAINE) 1 % injection 5 mL (5 mLs Other Given 07/19/23 0200)  lidocaine-EPINEPHrine-tetracaine (LET) topical gel (3 mLs Topical Given 07/19/23 0041)                                                                                                                                     Procedures .Incision and Drainage  Date/Time: 07/19/2023 2:35 AM  Performed by: Sloan Leiter, DO Authorized by: Sloan Leiter, DO   Consent:    Consent obtained:  Verbal   Consent given by:  Patient   Risks, benefits, and alternatives were discussed: yes     Risks discussed:  Bleeding, incomplete drainage and pain   Alternatives discussed:  No treatment and delayed treatment Universal protocol:    Procedure explained and questions answered to patient or proxy's satisfaction: yes     Patient identity confirmed:  Verbally with patient and arm band Location:    Type:  Bartholin cyst   Size:  1.5 cm   Location:  Anogenital   Anogenital location:  Bartholin's gland Pre-procedure details:    Skin preparation:  Antiseptic wash Sedation:     Sedation type:  None Anesthesia:    Anesthesia method:  Topical application and local infiltration   Topical anesthetic:  LET   Local anesthetic:  Lidocaine 1% w/o epi Procedure type:    Complexity:  Simple Procedure details:    Needle aspiration: yes     Needle size:  18 G   Incision types:  Stab incision   Wound management:  Irrigated with saline and probed and deloculated   Drainage:  Bloody   Drainage amount:  Scant   Wound treatment:  Wound left open Post-procedure details:    Procedure completion:  Tolerated well, no immediate complications   (including critical care time)  Medical Decision Making / ED Course    Medical Decision Making:    Quaneshia Wareing is a 43 y.o. female with past medical history as below, significant for hypothyroid, depressed, gestational diabetes, prior C-section who presents to the ED with complaint of vaginal pain. The complaint involves an extensive differential diagnosis and also carries with it a high risk of complications and morbidity.  Serious etiology was considered. Ddx includes but is not limited to: Cellulitis, Bartholin gland abscess, foreign body, etc.  Complete initial physical exam performed, notably the patient was in no distress, abdomen benign.    Reviewed and confirmed nursing documentation for past medical history, family history, social history.  Vital signs reviewed.         Brief summary: 43 year old female history as above here with apparent recurrent Bartholin gland abscess.  She had recent I&D and was started on Doxy, symptoms returned   Spanish interpreter utilized   I/D was completed, very scant purulent drainage obtained. Appears to be a small blood clot in the cavity as well. Partially removed, irrigated copiously. Recommend analgesia for home, sitz baths, f/u OBGYN, add augmentin to her doxy. Analgesia for home. Strict return precautions.   The patient improved significantly and was discharged in stable  condition. Detailed discussions were had with the patient/guardian regarding current findings, and need for close f/u with PCP or on call doctor. The patient/guardian has been instructed to return immediately if the symptoms worsen in any way for re-evaluation. Patient/guardian verbalized understanding and is in agreement with current care plan. All questions answered prior to discharge.             Additional history obtained: -Additional history obtained from na -External records from outside source obtained and reviewed including: Chart review including previous notes, labs, imaging, consultation notes including  Primary care documentation, home medications, prior admission   Lab Tests: na  EKG   EKG Interpretation Date/Time:  Thursday July 18 2023 17:31:21 EST Ventricular Rate:  74 PR Interval:  182 QRS Duration:  68 QT Interval:  360 QTC Calculation: 399 R Axis:   52  Text Interpretation: Normal sinus rhythm with sinus arrhythmia Cannot rule out Anterior infarct , age undetermined Abnormal ECG When compared with ECG of 10-May-2009 22:59, PREVIOUS ECG IS PRESENT no sig change from previous Confirmed by Arby Barrette [phone]) on 07/18/2023 5:56:20 PM         Imaging Studies ordered: na   Medicines ordered and prescription drug management: Meds ordered this encounter  Medications   oxyCODONE-acetaminophen (PERCOCET/ROXICET) 5-325 MG per tablet 1 tablet    Refill:  0   ondansetron (ZOFRAN-ODT) disintegrating tablet 4 mg   DISCONTD: lidocaine-EPINEPHrine-tetracaine (LET) topical gel   lidocaine (PF) (XYLOCAINE) 1 % injection 5 mL   lidocaine-EPINEPHrine-tetracaine (LET) topical gel   amoxicillin-clavulanate (AUGMENTIN) 875-125 MG tablet    Sig: Take 1 tablet by mouth every 12 (twelve) hours.    Dispense:  14 tablet    Refill:  0   oxyCODONE (ROXICODONE) 5 MG immediate release tablet    Sig: Take 1 tablet (5 mg total) by mouth every 4 (four) hours as needed  for severe pain (pain score 7-10).  Dispense:  10 tablet    Refill:  0   acetaminophen (TYLENOL) 325 MG tablet    Sig: Take 2 tablets (650 mg total) by mouth every 6 (six) hours as needed.    Dispense:  36 tablet    Refill:  0    -I have reviewed the patients home medicines and have made adjustments as needed   Consultations Obtained: na   Cardiac Monitoring: Continuous pulse oximetry interpreted by myself, 100% on RA.    Social Determinants of Health:  Diagnosis or treatment significantly limited by social determinants of health: spanish speaking   Reevaluation: After the interventions noted above, I reevaluated the patient and found that they have improved  Co morbidities that complicate the patient evaluation  Past Medical History:  Diagnosis Date   Depressed    Hyperthyroidism       Dispostion: Disposition decision including need for hospitalization was considered, and patient discharged from emergency department.    Final Clinical Impression(s) / ED Diagnoses Final diagnoses:  Bartholin's gland abscess        Sloan Leiter, DO 07/19/23 [phone]

## 2023-07-19 NOTE — Discharge Instructions (Addendum)
Recommend you perform sitz bath twice a day for the next 7 days.  No sexual intercourse until wound has healed.  Follow-up with OB/GYN.  It was a pleasure caring for you today in the emergency department.  Please return to the emergency department for any worsening or worrisome symptoms.      Le recomendamos realizar baos de [employer] veces al da [employer] prximos [address]  [address].  No tener relaciones sexuales hasta que la herida haya sanado.  Seguimiento con obstetra/gineclogo.  Fue un placer atenderle hoy en el departamento de emergencias.  Regrese al departamento de emergencias si cualquier sntoma que empeore o sea preocupante.

## 2023-09-03 ENCOUNTER — Ambulatory Visit: Payer: Self-pay | Admitting: Emergency Medicine
